# Patient Record
Sex: Female | Born: 1980
Health system: Southern US, Community
[De-identification: ages and names within clinical notes are randomized; demographics above are authoritative.]

## PROBLEM LIST (undated history)

## (undated) DIAGNOSIS — E0590 Thyrotoxicosis, unspecified without thyrotoxic crisis or storm: Secondary | ICD-10-CM

## (undated) HISTORY — PX: THYROIDECTOMY: SHX17

## (undated) HISTORY — PX: HERNIA REPAIR: SHX51

## (undated) HISTORY — PX: ABDOMINAL HYSTERECTOMY: SHX81

---

## 2002-02-23 ENCOUNTER — Emergency Department (HOSPITAL_COMMUNITY): Admission: EM | Admit: 2002-02-23 | Discharge: 2002-02-23 | Payer: Self-pay | Admitting: Emergency Medicine

## 2002-09-25 ENCOUNTER — Emergency Department (HOSPITAL_COMMUNITY): Admission: EM | Admit: 2002-09-25 | Discharge: 2002-09-25 | Payer: Self-pay | Admitting: Emergency Medicine

## 2004-01-16 ENCOUNTER — Emergency Department (HOSPITAL_COMMUNITY): Admission: EM | Admit: 2004-01-16 | Discharge: 2004-01-16 | Payer: Self-pay | Admitting: Emergency Medicine

## 2004-02-01 ENCOUNTER — Other Ambulatory Visit: Admission: RE | Admit: 2004-02-01 | Discharge: 2004-02-01 | Payer: Self-pay | Admitting: Obstetrics and Gynecology

## 2004-02-04 ENCOUNTER — Emergency Department (HOSPITAL_COMMUNITY): Admission: EM | Admit: 2004-02-04 | Discharge: 2004-02-04 | Payer: Self-pay | Admitting: Family Medicine

## 2004-05-25 ENCOUNTER — Inpatient Hospital Stay (HOSPITAL_COMMUNITY): Admission: AD | Admit: 2004-05-25 | Discharge: 2004-05-25 | Payer: Self-pay | Admitting: Obstetrics and Gynecology

## 2004-07-07 ENCOUNTER — Inpatient Hospital Stay (HOSPITAL_COMMUNITY): Admission: AD | Admit: 2004-07-07 | Discharge: 2004-07-07 | Payer: Self-pay | Admitting: Obstetrics and Gynecology

## 2004-07-09 ENCOUNTER — Inpatient Hospital Stay (HOSPITAL_COMMUNITY): Admission: AD | Admit: 2004-07-09 | Discharge: 2004-07-09 | Payer: Self-pay | Admitting: Obstetrics and Gynecology

## 2004-07-23 ENCOUNTER — Inpatient Hospital Stay (HOSPITAL_COMMUNITY): Admission: AD | Admit: 2004-07-23 | Discharge: 2004-07-25 | Payer: Self-pay | Admitting: Obstetrics and Gynecology

## 2004-08-29 ENCOUNTER — Inpatient Hospital Stay (HOSPITAL_COMMUNITY): Admission: AD | Admit: 2004-08-29 | Discharge: 2004-08-29 | Payer: Self-pay | Admitting: Obstetrics and Gynecology

## 2004-09-03 ENCOUNTER — Inpatient Hospital Stay (HOSPITAL_COMMUNITY): Admission: AD | Admit: 2004-09-03 | Discharge: 2004-09-06 | Payer: Self-pay | Admitting: Obstetrics and Gynecology

## 2004-09-03 ENCOUNTER — Encounter (INDEPENDENT_AMBULATORY_CARE_PROVIDER_SITE_OTHER): Payer: Self-pay | Admitting: Specialist

## 2004-09-10 ENCOUNTER — Inpatient Hospital Stay (HOSPITAL_COMMUNITY): Admission: AD | Admit: 2004-09-10 | Discharge: 2004-09-10 | Payer: Self-pay | Admitting: Obstetrics and Gynecology

## 2004-10-10 ENCOUNTER — Other Ambulatory Visit: Admission: RE | Admit: 2004-10-10 | Discharge: 2004-10-10 | Payer: Self-pay | Admitting: Obstetrics and Gynecology

## 2005-04-02 ENCOUNTER — Emergency Department (HOSPITAL_COMMUNITY): Admission: EM | Admit: 2005-04-02 | Discharge: 2005-04-02 | Payer: Self-pay | Admitting: Emergency Medicine

## 2005-11-14 ENCOUNTER — Other Ambulatory Visit: Admission: RE | Admit: 2005-11-14 | Discharge: 2005-11-14 | Payer: Self-pay | Admitting: Obstetrics and Gynecology

## 2005-12-15 ENCOUNTER — Encounter (INDEPENDENT_AMBULATORY_CARE_PROVIDER_SITE_OTHER): Payer: Self-pay | Admitting: *Deleted

## 2005-12-15 ENCOUNTER — Ambulatory Visit (HOSPITAL_COMMUNITY): Admission: RE | Admit: 2005-12-15 | Discharge: 2005-12-15 | Payer: Self-pay | Admitting: Obstetrics and Gynecology

## 2009-06-25 ENCOUNTER — Emergency Department (HOSPITAL_COMMUNITY): Admission: EM | Admit: 2009-06-25 | Discharge: 2009-06-26 | Payer: Self-pay | Admitting: Emergency Medicine

## 2010-01-02 ENCOUNTER — Inpatient Hospital Stay (HOSPITAL_COMMUNITY): Admission: AD | Admit: 2010-01-02 | Discharge: 2010-01-02 | Payer: Self-pay | Admitting: Obstetrics & Gynecology

## 2010-02-10 ENCOUNTER — Inpatient Hospital Stay (HOSPITAL_COMMUNITY): Admission: RE | Admit: 2010-02-10 | Discharge: 2010-02-13 | Payer: Self-pay | Admitting: Obstetrics and Gynecology

## 2010-02-13 ENCOUNTER — Ambulatory Visit: Payer: Self-pay | Admitting: Advanced Practice Midwife

## 2010-02-13 ENCOUNTER — Inpatient Hospital Stay (HOSPITAL_COMMUNITY): Admission: AD | Admit: 2010-02-13 | Discharge: 2010-02-13 | Payer: Self-pay | Admitting: Obstetrics and Gynecology

## 2011-03-05 LAB — FETAL FIBRONECTIN: Fetal Fibronectin: NEGATIVE

## 2011-03-05 LAB — URINE MICROSCOPIC-ADD ON

## 2011-03-05 LAB — URINALYSIS, ROUTINE W REFLEX MICROSCOPIC
Bilirubin Urine: NEGATIVE
Glucose, UA: NEGATIVE mg/dL
Hgb urine dipstick: NEGATIVE
Ketones, ur: NEGATIVE mg/dL
Nitrite: POSITIVE — AB
Protein, ur: NEGATIVE mg/dL
Specific Gravity, Urine: 1.025 (ref 1.005–1.030)
Urobilinogen, UA: 0.2 mg/dL (ref 0.0–1.0)
pH: 6 (ref 5.0–8.0)

## 2011-03-05 LAB — WET PREP, GENITAL
Clue Cells Wet Prep HPF POC: NONE SEEN
Trich, Wet Prep: NONE SEEN

## 2011-03-05 LAB — URINE CULTURE: Colony Count: 45000

## 2011-03-08 LAB — CBC
HCT: 28.5 % — ABNORMAL LOW (ref 36.0–46.0)
HCT: 30.8 % — ABNORMAL LOW (ref 36.0–46.0)
HCT: 31.9 % — ABNORMAL LOW (ref 36.0–46.0)
HCT: 38.3 % (ref 36.0–46.0)
Hemoglobin: 10 g/dL — ABNORMAL LOW (ref 12.0–15.0)
Hemoglobin: 10.4 g/dL — ABNORMAL LOW (ref 12.0–15.0)
Hemoglobin: 12.5 g/dL (ref 12.0–15.0)
Hemoglobin: 9.3 g/dL — ABNORMAL LOW (ref 12.0–15.0)
MCHC: 32.4 g/dL (ref 30.0–36.0)
MCHC: 32.4 g/dL (ref 30.0–36.0)
MCHC: 32.6 g/dL (ref 30.0–36.0)
MCHC: 32.7 g/dL (ref 30.0–36.0)
MCV: 85.8 fL (ref 78.0–100.0)
MCV: 86.1 fL (ref 78.0–100.0)
MCV: 86.6 fL (ref 78.0–100.0)
MCV: 86.6 fL (ref 78.0–100.0)
Platelets: 172 10*3/uL (ref 150–400)
Platelets: 194 10*3/uL (ref 150–400)
Platelets: 211 10*3/uL (ref 150–400)
Platelets: 224 10*3/uL (ref 150–400)
RBC: 3.29 MIL/uL — ABNORMAL LOW (ref 3.87–5.11)
RBC: 3.55 MIL/uL — ABNORMAL LOW (ref 3.87–5.11)
RBC: 3.71 MIL/uL — ABNORMAL LOW (ref 3.87–5.11)
RBC: 4.46 MIL/uL (ref 3.87–5.11)
RDW: 16 % — ABNORMAL HIGH (ref 11.5–15.5)
RDW: 16.1 % — ABNORMAL HIGH (ref 11.5–15.5)
RDW: 16.4 % — ABNORMAL HIGH (ref 11.5–15.5)
RDW: 16.5 % — ABNORMAL HIGH (ref 11.5–15.5)
WBC: 10.1 10*3/uL (ref 4.0–10.5)
WBC: 10.3 10*3/uL (ref 4.0–10.5)
WBC: 13.3 10*3/uL — ABNORMAL HIGH (ref 4.0–10.5)
WBC: 9.6 10*3/uL (ref 4.0–10.5)

## 2011-03-08 LAB — RPR: RPR Ser Ql: NONREACTIVE

## 2011-03-26 LAB — DIFFERENTIAL
Basophils Absolute: 0.1 10*3/uL (ref 0.0–0.1)
Basophils Relative: 1 % (ref 0–1)
Eosinophils Absolute: 0.2 10*3/uL (ref 0.0–0.7)
Eosinophils Relative: 2 % (ref 0–5)
Lymphocytes Relative: 34 % (ref 12–46)
Lymphs Abs: 3 10*3/uL (ref 0.7–4.0)
Monocytes Absolute: 0.7 10*3/uL (ref 0.1–1.0)
Monocytes Relative: 8 % (ref 3–12)
Neutro Abs: 5 10*3/uL (ref 1.7–7.7)
Neutrophils Relative %: 56 % (ref 43–77)

## 2011-03-26 LAB — BASIC METABOLIC PANEL
BUN: 12 mg/dL (ref 6–23)
CO2: 24 mEq/L (ref 19–32)
Calcium: 9.1 mg/dL (ref 8.4–10.5)
Chloride: 105 mEq/L (ref 96–112)
Creatinine, Ser: 0.62 mg/dL (ref 0.4–1.2)
GFR calc Af Amer: >60 mL/min (ref >60)
GFR calc non Af Amer: >60 mL/min (ref >60)
Glucose, Bld: 92 mg/dL (ref 70–99)
Potassium: 3.6 mEq/L (ref 3.5–5.1)
Sodium: 136 mEq/L (ref 135–145)

## 2011-03-26 LAB — CBC
HCT: 40.7 % (ref 36.0–46.0)
Hemoglobin: 13.2 g/dL (ref 12.0–15.0)
MCHC: 32.5 g/dL (ref 30.0–36.0)
MCV: 94.9 fL (ref 78.0–100.0)
Platelets: 219 10*3/uL (ref 150–400)
RBC: 4.29 MIL/uL (ref 3.87–5.11)
RDW: 12.1 % (ref 11.5–15.5)
WBC: 9 10*3/uL (ref 4.0–10.5)

## 2011-03-26 LAB — URINALYSIS, ROUTINE W REFLEX MICROSCOPIC
Bilirubin Urine: NEGATIVE
Glucose, UA: NEGATIVE mg/dL
Ketones, ur: NEGATIVE mg/dL
Leukocytes, UA: NEGATIVE
Nitrite: NEGATIVE
Protein, ur: NEGATIVE mg/dL
Specific Gravity, Urine: 1.028 (ref 1.005–1.030)
Urobilinogen, UA: 0.2 mg/dL (ref 0.0–1.0)
pH: 6.5 (ref 5.0–8.0)

## 2011-03-26 LAB — HCG, QUANTITATIVE, PREGNANCY: hCG, Beta Chain, Quant, S: 39224 m[IU]/mL — ABNORMAL HIGH (ref <5)

## 2011-03-26 LAB — ABO/RH: ABO/RH(D): O POS

## 2011-03-26 LAB — URINE MICROSCOPIC-ADD ON

## 2011-03-26 LAB — POCT PREGNANCY, URINE: Preg Test, Ur: POSITIVE

## 2011-05-05 NOTE — Op Note (Signed)
NAME:  Alison Scott, BOAK NO.:  [id number]   MEDICAL RECORD NO.:  [id number]                   PATIENT TYPE:  INP   LOCATION:  9126                                 FACILITY:  [facility]   PHYSICIAN:  Rudy Jew. Ashley Royalty, M.D.             DATE OF BIRTH:  [birth year]   DATE OF PROCEDURE:  09/03/2004  DATE OF DISCHARGE:                                 OPERATIVE REPORT   PREOPERATIVE DIAGNOSES:  1.  Intrauterine pregnancy at 17 weeks' gestation.  2.  Previous cesarean section.   POSTOPERATIVE DIAGNOSES:  1.  Intrauterine pregnancy at 85 weeks' gestation.  2.  Previous cesarean section.  3.  Pelvic adhesions, dense.   PROCEDURE:  1.  Repeat low transverse cesarean section.  2.  Lysis of adhesions.   SURGEON:  Rudy Jew. Ashley Royalty, M.D.   ANESTHESIA:  Spinal.   FINDINGS:  Six pound 3 ounce female, Apgars 8 at one minute, 9 at five  minutes, sent to the newborn nursery.   ESTIMATED BLOOD LOSS:  1400 mL.   COMPLICATIONS:  None.   PACKS AND DRAINS:  Foley.   COUNTS:  Sponge, needle and instrument count reported x2.   DESCRIPTION OF PROCEDURE:  The patient was taken to the operating room and  placed in the sitting position.  After a spinal anesthetic was administered,  she was then placed in the dorsal supine position and prepped and draped in  the usual manner for abdominal surgery.  Foley catheter was placed.  A  Pfannenstiel incision was made down through the patient's old skin incision.  The subcutaneous tissues were sharply dissected down to the fascia which was  nicked with a knife and incised transversely with Mayo scissors.  The  underlying rectus muscles were separated from the fascia using sharp and  blunt dissection.  During this process, dense adhesions were encountered,  and it was difficult to successful dissect in the abdominal cavity.  Once  the abdominal cavity was entered, additional dense adhesions were noted  between the uterus and anterior  abdominal wall.  There was some serosal  trauma to the anterior aspect of the uterine corpus which would later  require reapproximation.  With considerable effort and diligence, all of the  adhesions were lysed.  At this point, the uterus could be fully visualized.  A bladder flap was created by the side of the anterior uterine serosa, and  the bladder was sharply and bluntly dissected inferiorly.  It was held in  place with the bladder blade.  All in all from the initiation of the  procedure to time of uterine incision was approximately 35 minutes.   The uterus was then entered through a combination of sharp and blunt  dissection through a low transverse incision.  The amniotic fluid was noted  to be clear.  The infant was delivered from the vertex presentation in an  atraumatic fashion.  The amniotic fluid  was noted to be clear.  The infant  was delivered from the vertex presentation in an atraumatic manner.  The  infant was suctioned.  The cord was triply clamped, cut, and the infant  given immediately to the waiting pediatrics team.  The arterial cord pH was  obtained from an isolated segment.  Regular cord blood was obtained.  The  placenta and membranes were removed in their entirety and submitted to  pathology for histologic studies.  Later, the lab informed the room that the  pH had clotted and could not be interpreted.  As the infant was doing quite  well at that point, no further attempt was made to obtain cord blood for pH  determination.  The uterus was exteriorized . The uterus was then closed in  two running layers with #1 Vicryl.  The first was a running locking layer.  The second was a running, intermittent locking, imbricating layer.   At this point, the superficial serosa trauma superiorly noted on the uterus  relative to the main uterine incision was repaired without difficulty.  A 0  Vicryl running suture was employed for this purpose.  Hemostasis was noted.   Thereafter at this point, the uterus, tubes and ovaries were inspected and  found to be otherwise normal.  They were returned to the abdominal cavity.  Copious irrigation was accomplished.  Hemostasis was noted.  The peritoneum  was then closed with 3-0 Vicryl in a running fashion.  The fascia was closed  with 0 Vicryl in a running fashion.  A small button hole in the fascia  superior to the principal fascial incision was closed with 0 Vicryl in an  interrupted fashion.  The fascia was closed in its entirety with 0 Vicryl in  a running fashion.  Copious irrigation was accomplished.  Hemostasis was  noted.  The skin was closed with staples.  At the conclusion of the  procedure, the urine was clear and copious.  The patient tolerated the  procedure extremely well and was returned to the recovery room in good  condition.      JAM/MEDQ  D:  09/04/2004  T:  09/05/2004  Job:  [id number]

## 2011-05-05 NOTE — H&P (Signed)
NAME:  Alison Scott, Alison Scott               ACCOUNT NO.:  [id number]   MEDICAL RECORD NO.:  [id number]          PATIENT TYPE:   LOCATION:                                 FACILITY:   PHYSICIAN:  Rudy Jew. Ashley Royalty, M.D.     DATE OF BIRTH:   DATE OF ADMISSION:  12/15/2005  DATE OF DISCHARGE:                                HISTORY & PHYSICAL   This is a 30 year old gravida 5, para 2, AB 3, who recently had a Pap smear  in my office revealing CIN-I.  Colposcopy was performed December 08, 2005,  and revealed no lesions.  However, the colposcopy was felt to be inadequate.  Hence, the patient is scheduled for loop electrical excision procedure.   MEDICATIONS:  None.   PAST MEDICAL HISTORY:  Medical:  Negative.   Surgical:  C-section.   ALLERGIES:  No known drug allergies.   FAMILY HISTORY:  Noncontributory.   SOCIAL HISTORY:  The patient is a smoker.  She denies any significant use of  alcohol.   REVIEW OF SYSTEMS:  Noncontributory.   PHYSICAL EXAMINATION:  GENERAL:  A well-developed, well-nourished, pleasant  black female in no acute distress.  VITAL SIGNS:  Afebrile, vital signs stable.  CHEST:  Lungs are clear.  CARDIAC:  Regular rate and rhythm.  ABDOMEN:  Soft and nontender.  MUSCULOSKELETAL:  No CVA tenderness.  PELVIC:  Please see December 08, 2005.  External genitalia within normal  limits.  Vagina and cervix were without gross lesions.  Bimanual examination  performed recently in the office revealed the uterus to be approximately 8 x  4 x 4 cm and no adnexal masses palpable.   IMPRESSION:  1.  Cervical intraepithelial neoplasia I on recent Pap.  2.  Unsatisfactory colposcopy.   PLAN:  Loop electrical excision procedure.  The risks, benefits,  complications and alternatives were fully discussed with the patient.  She  states she understands and accepts.  Questions were invited and answered.  The patient requested the procedure to be done at the outpatient surgical  center  at [facility] as opposed to in the office setting.      Brossard A. Ashley Royalty, M.D.  Electronically Signed     JAM/MEDQ  D:  12/15/2005  T:  12/15/2005  Job:  [id number]

## 2011-05-05 NOTE — H&P (Signed)
NAME:  Alison Scott, Alison Scott NO.:  [id number]   MEDICAL RECORD NO.:  [id number]                   PATIENT TYPE:  MAT   LOCATION:  MATC                                 [facility]:  [facility]   PHYSICIAN:  Rudy Jew. Ashley Royalty, M.D.             DATE OF BIRTH:  1981/05/12   DATE OF ADMISSION:  07/23/2004  DATE OF DISCHARGE:                                HISTORY & PHYSICAL   HISTORY OF PRESENT ILLNESS:  [age]-year-old gravida 5, para 1-0-3-1,  [facility] September 10, 2004, approximately 33 weeks' gestation.  Prenatal care  complicated by previous cesarean section and preterm labor.  The patient has  recently been maintained on terbutaline 5 mg p.o. q.6 h.  Earlier today, she  began breaking through to the point that she came to Maternity Admissions.  She was at [facility] on or about July 07, 2004, at which time  cultures were obtained and the terbutaline adjusted for continued outpatient  therapy.   PAST MEDICAL HISTORY:  For the remainder of the past medical history, please  see chart.   PHYSICAL EXAMINATION:  GENERAL:  Well-developed, well-nourished, pleasant  black female in no acute distress, afebrile.  VITAL SIGNS:  Vital signs stable.  SKIN:  Skin warm and dry without lesions.  LYMPH:  There is no supraclavicular, cervical or inguinal adenopathy.  HEENT:  Normocephalic.  NECK:  Neck supple without thyromegaly.  CHEST:  Lungs are clear.  HEART:  Regular rate and rhythm without murmurs, gallops or rubs.  ABDOMEN:  Abdomen is gravid with a fundal height of approximately 33.  Fetal  heart tones are auscultated.  Contractions are approximately every 10  minutes.  MUSCULOSKELETAL:  Examination reveals full range of motion without edema,  cyanosis or CVA tenderness.  PELVIC:  Cervical examination reveals the cervix to be closed, soft,  anterior with presenting part high.   IMAGING STUDIES:  Ultrasound revealed a cervical length of 2.8 cm.  This is  in  contrast to the last one several weeks ago, which was consistent with a  length of 3.0 cm.  Biophysical profile was 8/8.   IMPRESSION:  1. Intrauterine pregnancy at 29 weeks' gestation.  2. Previous cesarean section.  3. Preterm labor with modest progressive cervical shortening on outpatient     terbutaline.   PLAN:  1. Admit.  2. IV magnesium therapy.  3. Will give betamethasone 12.5 mg now with repeat in 24 hours.  Risks,     benefits, complications and alternatives fully discussed with the     patient.  She states she understands and accepts.  Questions invited and     answered.                                              Tomey A. Ashley Royalty, M.D.  JAM/MEDQ  D:  07/23/2004  T:  07/23/2004  Job:  [id number]

## 2011-05-05 NOTE — Discharge Summary (Signed)
NAMESHARINA, Alison Scott              ACCOUNT NO.:  [id number]   MEDICAL RECORD NO.:  [id number]          PATIENT TYPE:  INP   LOCATION:  9126                          [facility]ILITY[facility]  [facility]   PHYSICIAN:  Rudy Jew. Ashley Royalty, M.D.DATE OF BIRTH:  Apr 20, 1981   DATE OF ADMISSION:  09/03/2004  DATE OF DISCHARGE:  09/06/2004                                 DISCHARGE SUMMARY   DISCHARGE DIAGNOSES:  1.  Intrauterine pregnancy at term, delivered.  2.  Previous cesarean section.  3.  Labor.   OPERATIONS AND SPECIAL PROCEDURES:  Repeat low transverse cesarean section  with lysis of adhesions.   CONSULTATIONS:  None.   DISCHARGE MEDICATIONS:  Percocet, Chromagen.   HISTORY AND PHYSICAL:  This is a 30 year old gravida 5 para 1-0-3-1 at [redacted]  weeks gestation.  Prenatal care was complicated by previous cesarean section  and preterm labor.  The patient was scheduled for a repeat cesarean section  September 05, 2004.  She presented in labor.  For the remainder of the  history and physical, please see chart.   HOSPITAL COURSE:  The patient was admitted to [facility]  [place].  Admission laboratory studies were drawn.  On September 03, 2004  she was taken to the operating room and underwent repeat low transverse  cesarean section.  There were significant pelvic adhesions present and thus  the patient also underwent lysis of adhesions.  The infant was a 6-pound 3-  ounce female, Apgars 8 at one minute and 9 at five minutes, sent to the  newborn nursery.  The patient's postpartum course was complicated only by a  modest anemia which was asymptomatic.  On postoperative day #3 she was  discharged home afebrile and in satisfactory condition.   ACCESSORY CLINICAL FINDINGS:  Hemoglobin and hematocrit on admission were  12.1 and 36.0 respectively.  Repeat values obtained September 05, 2004 were  8.2 and 24.8 respectively.  Type and Rh revealed O positive blood.   DISPOSITION:  The patient is to return to  [facility]etrics  in 4-6 weeks for postpartum evaluation.      JAM/MEDQ  D:  09/29/2004  T:  09/29/2004  Job:  [id number]

## 2011-05-05 NOTE — Discharge Summary (Signed)
NAME:  Alison Scott, Alison Scott              ACCOUNT NO.:  [id number]   MEDICAL RECORD NO.:  [id number]          PATIENT TYPE:  INP   LOCATION:  9159                          [facility]   PHYSICIAN:  Rudy Jew. Ashley Royalty, M.D.DATE OF BIRTH:  10/05/1981   DATE OF ADMISSION:  07/23/2004  DATE OF DISCHARGE:  07/25/2004                                 DISCHARGE SUMMARY   DISCHARGE DIAGNOSES:  1.  Intrauterine pregnancy at [redacted] weeks gestation.  2.  Previous cesarean section.  3.  Preterm labor.   OPERATIONS AND SPECIAL PROCEDURES:  None.   CONSULTATIONS:  None.   DISCHARGE MEDICATIONS:  Terbutaline.   HISTORY AND PHYSICAL:  This is a 30 year old gravida 5 para 1-0-3-1 with [facility]  September 10, 2004 at approximately [redacted] weeks gestation.  Prenatal care was  complicated by a previous cesarean section and preterm labor.  The patient  has been maintained during this gestation on terbutaline 5 mg p.o. q.6h.  Earlier today, she began breaking through to the point that she went to  maternity admissions.  She was noted to have contractions approximately  every 10 minutes at maternity admissions and was thus admitted for further  evaluation and therapy.  For the remainder of the history and physical,  please see chart.   HOSPITAL COURSE:  The patient was admitted to [facility]  [place].  Admission laboratory studies were drawn.  She was placed on IV  magnesium sulfate.  In addition, she was given betamethasone to accelerate  the fetal lung maturity.  On July 24, 2004 she was changed to terbutaline.  On July 25, 2004 she was felt to have responded satisfactorily to the  tocolytic agents and was discharged home on p.o. terbutaline afebrile and in  satisfactory condition.   DISPOSITION:  The patient is to return to [facility] and Obstetrics  in the next several days for further evaluation and therapy.      JAM/MEDQ  D:  09/29/2004  T:  09/29/2004  Job:  [id number]

## 2011-05-05 NOTE — Op Note (Signed)
NAME:  Alison Scott, Alison Scott               ACCOUNT NO.:  [id number]   MEDICAL RECORD NO.:  [id number]          PATIENT TYPE:  AMB   LOCATION:                                [facility]   PHYSICIAN:  Beharry A. Ashley Royalty, M.D.DATE OF BIRTH:  Jan 04, 1981   DATE OF PROCEDURE:  12/15/2005  DATE OF DISCHARGE:                                 OPERATIVE REPORT   PREOPERATIVE DIAGNOSES:  1.  Cervical intraepithelial neoplasia I on recent Papanicolaou smear.  2.  Unsatisfactory colposcopy.  3.  Desire for diagnostic/therapeutic procedure in the operating room as      opposed to the office.   POSTOPERATIVE DIAGNOSES:  1.  Cervical intraepithelial neoplasia I on recent Papanicolaou smear.  2.  Unsatisfactory colposcopy.  3.  Desire for diagnostic/therapeutic procedure in the operating room as      opposed to the office.  4.  Pathology pending.   PROCEDURE:  Loop electrical excision procedure.   SURGEON:  Rudy Jew. Ashley Royalty, M.D.   ANESTHESIA:  General.   ESTIMATED BLOOD LOSS:  Less than 25 mL.   COMPLICATIONS:  None.   PACKS AND DRAINS:  None.   PROCEDURE:  The patient was taken to the operating room and placed in dorsal  supine position.  After anesthetic was administered, she was placed in the  lithotomy position and prepped and draped in usual manner for vaginal  surgery.  The cervix was visualized with the colposcope.  Similar findings  were seen as an office on December 08, 2005.  Using the small (green) LEEP  electrode,  a LEEP specimen was obtained using the cutting waveform at 30  watts power.  The specimen was incised at 12 o'clock and submitted to  pathology for histologic studies in formalin.  Next the ball was used using  the coagulation waveform at 50 watts power to obtain hemostasis.  Hemostasis  was noted.  The bed was treated with Monsel's solution as well.  The  procedure was terminated.   The patient was then taken to the recovery room in excellent condition.     Hemberger  A. Ashley Royalty, M.D.  Electronically Signed    JAM/MEDQ  D:  12/22/2005  T:  12/22/2005  Job:  [id number]

## 2011-07-12 ENCOUNTER — Encounter: Payer: Self-pay | Admitting: *Deleted

## 2011-07-12 ENCOUNTER — Emergency Department (INDEPENDENT_AMBULATORY_CARE_PROVIDER_SITE_OTHER): Payer: BC Managed Care – PPO

## 2011-07-12 ENCOUNTER — Emergency Department (HOSPITAL_BASED_OUTPATIENT_CLINIC_OR_DEPARTMENT_OTHER)
Admission: EM | Admit: 2011-07-12 | Discharge: 2011-07-12 | Disposition: A | Discharge status: Home / Self Care | Payer: BC Managed Care – PPO | Attending: Emergency Medicine | Admitting: Emergency Medicine

## 2011-07-12 DIAGNOSIS — G43909 Migraine, unspecified, not intractable, without status migrainosus: Secondary | ICD-10-CM

## 2011-07-12 DIAGNOSIS — R197 Diarrhea, unspecified: Secondary | ICD-10-CM

## 2011-07-12 DIAGNOSIS — R110 Nausea: Secondary | ICD-10-CM

## 2011-07-12 DIAGNOSIS — R51 Headache: Secondary | ICD-10-CM

## 2011-07-12 LAB — CBC
HCT: 37.5 % (ref 36.0–46.0)
Hemoglobin: 12.6 g/dL (ref 12.0–15.0)
MCH: 29 pg (ref 26.0–34.0)
MCHC: 33.6 g/dL (ref 30.0–36.0)
MCV: 86.2 fL (ref 78.0–100.0)
Platelets: 267 10*3/uL (ref 150–400)
RBC: 4.35 MIL/uL (ref 3.87–5.11)
RDW: 12.4 % (ref 11.5–15.5)
WBC: 6.1 10*3/uL (ref 4.0–10.5)

## 2011-07-12 LAB — BASIC METABOLIC PANEL
BUN: 14 mg/dL (ref 6–23)
CO2: 26 mEq/L (ref 19–32)
Calcium: 10 mg/dL (ref 8.4–10.5)
Chloride: 104 mEq/L (ref 96–112)
Creatinine, Ser: <0.47 mg/dL — ABNORMAL LOW (ref 0.50–1.10)
Glucose, Bld: 96 mg/dL (ref 70–99)
Potassium: 3.5 mEq/L (ref 3.5–5.1)
Sodium: 141 mEq/L (ref 135–145)

## 2011-07-12 LAB — PREGNANCY, URINE: Preg Test, Ur: NEGATIVE

## 2011-07-12 MED ORDER — KETOROLAC TROMETHAMINE 30 MG/ML IJ SOLN
30.0000 mg | Freq: Once | INTRAMUSCULAR | Status: AC
  Administered 2011-07-12: 30 mg via INTRAVENOUS
  Filled 2011-07-12: qty 1

## 2011-07-12 MED ORDER — BUTALBITAL-ASA-CAFFEINE 50-325-40 MG PO CAPS: 1.0000 | ORAL_CAPSULE | Freq: Two times a day (BID) | ORAL | Status: AC | PRN

## 2011-07-12 MED ORDER — ONDANSETRON 8 MG PO TBDP: 8.0000 mg | ORAL_TABLET | Freq: Three times a day (TID) | ORAL | Status: AC | PRN

## 2011-07-12 MED ORDER — METOCLOPRAMIDE HCL 5 MG/ML IJ SOLN
10.0000 mg | Freq: Once | INTRAMUSCULAR | Status: AC
  Administered 2011-07-12: 10 mg via INTRAVENOUS
  Filled 2011-07-12: qty 2

## 2011-07-12 MED ORDER — DIPHENHYDRAMINE HCL 50 MG/ML IJ SOLN
12.5000 mg | Freq: Once | INTRAMUSCULAR | Status: AC
  Administered 2011-07-12: 12.5 mg via INTRAVENOUS
  Filled 2011-07-12: qty 1

## 2011-07-12 NOTE — ED Notes (Signed)
Pt c/o headache intermittently x 1-2 weeks. Also c/o nausea. Some greenish colored diarrhea x2-3days. Denies abdominal pains or vomiting. Pt mostly concerned for severe headache today.

## 2011-07-12 NOTE — ED Provider Notes (Addendum)
History     Chief Complaint  Patient presents with  . Headache  . Nausea  . Diarrhea   HPI Comments: Light and noise seems to exacerbate the headache.  It tends to resolves after a period of time. Patient states she had a little bit of a GI bug several days ago but then that seemed to resolve. She noticed last couple of days having this headache but at times tends to be more severe but then will completely resolve. She was driving on her way to work today the headache became more severe again and she felt very lightheaded so she decided to drive to the emergency room. She does not have a history of headaches like this in the past. There is no sudden onset and she's not had any neck pain or neck stiffness.  Patient is a 30 y.o. female presenting with headaches.  Headache  This is a new problem. The current episode started more than 2 days ago. Progression since onset: it has been waxing and waning. The headache is associated with nothing. The pain is located in the frontal region. Quality: aqueezing. The pain is moderate. The pain does not radiate. Associated symptoms include malaise/fatigue, near-syncope and nausea. Pertinent negatives include no fever, no chest pressure, no syncope, no shortness of breath and no vomiting. She has tried NSAIDs for the symptoms.    History reviewed. No pertinent past medical history.  Past Surgical History  Procedure Date  . Hernia repair   . Cesarean section     History reviewed. No pertinent family history.  History  Substance Use Topics  . Smoking status: Former Games developer  . Smokeless tobacco: Not on file  . Alcohol Use: Yes     occasionally    OB History    Grav Para Term Preterm Abortions TAB SAB Ect Mult Living                  Review of Systems  Constitutional: Positive for malaise/fatigue. Negative for fever.  Respiratory: Negative for shortness of breath.   Cardiovascular: Positive for near-syncope. Negative for syncope.    Gastrointestinal: Positive for nausea. Negative for vomiting and diarrhea (a few days ago).  Neurological: Positive for headaches.  All other systems reviewed and are negative.    Physical Exam  BP 119/66  Pulse 116  Temp(Src) 99 F (37.2 C) (Oral)  Resp 18  Ht 5\' 2"  (1.575 m)  Wt 155 lb (70.308 kg)  BMI 28.35 kg/m2  SpO2 100%  Physical Exam  Constitutional: She is oriented to person, place, and time. She appears well-developed and well-nourished. No distress.  HENT:  Head: Normocephalic and atraumatic.  Right Ear: External ear normal.  Left Ear: External ear normal.  Eyes: Conjunctivae are normal. Right eye exhibits no discharge. Left eye exhibits no discharge. No scleral icterus.  Neck: Neck supple. No rigidity. No tracheal deviation and normal range of motion present. No Kernig's sign noted.  Cardiovascular: Normal rate, regular rhythm and intact distal pulses.   Pulmonary/Chest: Effort normal and breath sounds normal. No stridor. No respiratory distress. She has no wheezes. She has no rales.  Abdominal: Soft. Bowel sounds are normal. She exhibits no distension. There is no tenderness. There is no rebound and no guarding.  Musculoskeletal: She exhibits no edema and no tenderness.  Neurological: She is alert and oriented to person, place, and time. She has normal strength. No cranial nerve deficit ( no gross defecits noted) or sensory deficit. She exhibits normal muscle  tone. She displays no seizure activity. Coordination normal. GCS eye subscore is 4. GCS verbal subscore is 5. GCS motor subscore is 6.       5 out of 5 strength bilateral upper extremity and lower extremity, normal sensation to light touch throughout all extremities,  Skin: Skin is warm and dry. No rash noted.  Psychiatric: She has a normal mood and affect.    ED Course  Procedures  Labs Reviewed  CBC  PREGNANCY, URINE  BASIC METABOLIC PANEL   all within normal limits  Ct Head Wo Contrast  07/12/2011   *RADIOLOGY REPORT*  Clinical Data: Headache, nausea and diarrhea.  CT HEAD WITHOUT CONTRAST  Technique:  Contiguous axial images were obtained from the base of the skull through the vertex without contrast.  Comparison: None.  Findings: The brain appears normal without acute infarct, hemorrhage, mass, mass effect, midline shift or abnormal extra axial fluid collection. No hydrocephalus. Imaged paranasal sinuses and mastoid air cells appear clear.  IMPRESSION: Negative exam.  Original Report Authenticated By: [id number]  8:22 AM  resting comfortably  8:49 AM Patient feels better at this time but he did go home.  findings and plans were discussed with her MDM Symptoms sound similar to a migraine type of headache with 6 waxing and waning nature. She mentioned to gets better after she lies down and rests. At this time doubt meningitis, subarachnoid hemorrhage. Will check labs and perform a noncontrast head CT at this time to rule out any sort of mass lesion.  Workup has been negative. Patient treated empirically for migraine type headache. Suspect this is the etiology of her symptoms discharged home to follow up with primary care doctor will prescribe symptomatic medications      Celene Kras, MD 07/12/11 [id number]  Celene Kras, MD 07/12/11 [phone]

## 2011-07-12 NOTE — ED Notes (Signed)
IV started, blood collected and pt medicated.  She reports a headache 5/10 prior to administration of medication..  Lights dimmed for comfort.

## 2011-10-19 ENCOUNTER — Encounter: Payer: Self-pay | Admitting: *Deleted

## 2011-10-19 ENCOUNTER — Emergency Department (INDEPENDENT_AMBULATORY_CARE_PROVIDER_SITE_OTHER): Payer: Self-pay

## 2011-10-19 ENCOUNTER — Emergency Department (HOSPITAL_BASED_OUTPATIENT_CLINIC_OR_DEPARTMENT_OTHER)
Admission: EM | Admit: 2011-10-19 | Discharge: 2011-10-19 | Disposition: A | Discharge status: Home / Self Care | Payer: Self-pay | Attending: Emergency Medicine | Admitting: Emergency Medicine

## 2011-10-19 DIAGNOSIS — N83209 Unspecified ovarian cyst, unspecified side: Secondary | ICD-10-CM | POA: Insufficient documentation

## 2011-10-19 DIAGNOSIS — R110 Nausea: Secondary | ICD-10-CM

## 2011-10-19 DIAGNOSIS — R1031 Right lower quadrant pain: Secondary | ICD-10-CM

## 2011-10-19 DIAGNOSIS — B9689 Other specified bacterial agents as the cause of diseases classified elsewhere: Secondary | ICD-10-CM | POA: Insufficient documentation

## 2011-10-19 DIAGNOSIS — N838 Other noninflammatory disorders of ovary, fallopian tube and broad ligament: Secondary | ICD-10-CM

## 2011-10-19 DIAGNOSIS — N760 Acute vaginitis: Secondary | ICD-10-CM | POA: Insufficient documentation

## 2011-10-19 DIAGNOSIS — R7989 Other specified abnormal findings of blood chemistry: Secondary | ICD-10-CM | POA: Insufficient documentation

## 2011-10-19 DIAGNOSIS — A499 Bacterial infection, unspecified: Secondary | ICD-10-CM | POA: Insufficient documentation

## 2011-10-19 DIAGNOSIS — K5289 Other specified noninfective gastroenteritis and colitis: Secondary | ICD-10-CM | POA: Insufficient documentation

## 2011-10-19 DIAGNOSIS — K529 Noninfective gastroenteritis and colitis, unspecified: Secondary | ICD-10-CM

## 2011-10-19 LAB — COMPREHENSIVE METABOLIC PANEL
ALT: 58 U/L — ABNORMAL HIGH (ref 0–35)
AST: 45 U/L — ABNORMAL HIGH (ref 0–37)
Albumin: 3.5 g/dL (ref 3.5–5.2)
Alkaline Phosphatase: 159 U/L — ABNORMAL HIGH (ref 39–117)
BUN: 10 mg/dL (ref 6–23)
CO2: 24 mEq/L (ref 19–32)
Calcium: 9.5 mg/dL (ref 8.4–10.5)
Chloride: 104 mEq/L (ref 96–112)
Creatinine, Ser: 0.4 mg/dL — ABNORMAL LOW (ref 0.50–1.10)
GFR calc Af Amer: >90 mL/min (ref >90)
GFR calc non Af Amer: >90 mL/min (ref >90)
Glucose, Bld: 106 mg/dL — ABNORMAL HIGH (ref 70–99)
Potassium: 3.7 mEq/L (ref 3.5–5.1)
Sodium: 139 mEq/L (ref 135–145)
Total Bilirubin: 0.2 mg/dL — ABNORMAL LOW (ref 0.3–1.2)
Total Protein: 6.9 g/dL (ref 6.0–8.3)

## 2011-10-19 LAB — WET PREP, GENITAL
Trich, Wet Prep: NONE SEEN
Yeast Wet Prep HPF POC: NONE SEEN

## 2011-10-19 LAB — CBC
HCT: 36.5 % (ref 36.0–46.0)
Hemoglobin: 12.2 g/dL (ref 12.0–15.0)
MCH: 27 pg (ref 26.0–34.0)
MCHC: 33.4 g/dL (ref 30.0–36.0)
MCV: 80.8 fL (ref 78.0–100.0)
Platelets: 247 10*3/uL (ref 150–400)
RBC: 4.52 MIL/uL (ref 3.87–5.11)
RDW: 12.7 % (ref 11.5–15.5)
WBC: 5.7 10*3/uL (ref 4.0–10.5)

## 2011-10-19 LAB — DIFFERENTIAL
Basophils Absolute: 0 10*3/uL (ref 0.0–0.1)
Basophils Relative: 0 % (ref 0–1)
Eosinophils Absolute: 0.1 10*3/uL (ref 0.0–0.7)
Eosinophils Relative: 2 % (ref 0–5)
Lymphocytes Relative: 45 % (ref 12–46)
Lymphs Abs: 2.6 10*3/uL (ref 0.7–4.0)
Monocytes Absolute: 0.6 10*3/uL (ref 0.1–1.0)
Monocytes Relative: 11 % (ref 3–12)
Neutro Abs: 2.4 10*3/uL (ref 1.7–7.7)
Neutrophils Relative %: 42 % — ABNORMAL LOW (ref 43–77)

## 2011-10-19 LAB — URINALYSIS, ROUTINE W REFLEX MICROSCOPIC
Bilirubin Urine: NEGATIVE
Glucose, UA: NEGATIVE mg/dL
Hgb urine dipstick: NEGATIVE
Ketones, ur: NEGATIVE mg/dL
Leukocytes, UA: NEGATIVE
Nitrite: NEGATIVE
Protein, ur: NEGATIVE mg/dL
Specific Gravity, Urine: 1.01 (ref 1.005–1.030)
Urobilinogen, UA: 1 mg/dL (ref 0.0–1.0)
pH: 7.5 (ref 5.0–8.0)

## 2011-10-19 LAB — PREGNANCY, URINE: Preg Test, Ur: NEGATIVE

## 2011-10-19 MED ORDER — OXYCODONE-ACETAMINOPHEN 5-325 MG PO TABS: 2.0000 | ORAL_TABLET | ORAL | Status: AC | PRN

## 2011-10-19 MED ORDER — MORPHINE SULFATE 4 MG/ML IJ SOLN
4.0000 mg | Freq: Once | INTRAMUSCULAR | Status: AC
  Administered 2011-10-19: 4 mg via INTRAVENOUS
  Filled 2011-10-19: qty 1

## 2011-10-19 MED ORDER — ONDANSETRON HCL 4 MG/2ML IJ SOLN
4.0000 mg | Freq: Once | INTRAMUSCULAR | Status: AC
  Administered 2011-10-19: 4 mg via INTRAVENOUS
  Filled 2011-10-19: qty 2

## 2011-10-19 MED ORDER — METRONIDAZOLE 500 MG PO TABS: 500.0000 mg | ORAL_TABLET | Freq: Two times a day (BID) | ORAL | Status: AC

## 2011-10-19 MED ORDER — IOHEXOL 300 MG/ML  SOLN
100.0000 mL | Freq: Once | INTRAMUSCULAR | Status: AC | PRN
  Administered 2011-10-19: 100 mL via INTRAVENOUS

## 2011-10-19 MED ORDER — CIPROFLOXACIN HCL 500 MG PO TABS: 500.0000 mg | ORAL_TABLET | Freq: Two times a day (BID) | ORAL | Status: AC

## 2011-10-19 NOTE — ED Provider Notes (Signed)
History     CSN: [id number] Arrival date & time: 10/19/2011  2:59 PM   First MD Initiated Contact with Patient 10/19/11 1630      Chief Complaint  Patient presents with  . Abdominal Pain    rlq    (Consider location/radiation/quality/duration/timing/severity/associated sxs/prior treatment) Patient is a 30 y.o. female presenting with abdominal pain. The history is provided by the patient. No language interpreter was used.  Abdominal Pain The primary symptoms of the illness include abdominal pain and nausea. The primary symptoms of the illness do not include vomiting, diarrhea or vaginal discharge. The current episode started 13 to 24 hours ago. The onset of the illness was gradual. The problem has been gradually worsening.  The patient states that she believes she is currently not pregnant. The patient has not had a change in bowel habit. Symptoms associated with the illness do not include diaphoresis, constipation, frequency or back pain.    History reviewed. No pertinent past medical history.  No past surgical history on file.  No family history on file.  History  Substance Use Topics  . Smoking status: Not on file  . Smokeless tobacco: Not on file  . Alcohol Use: Not on file    OB History    Grav Para Term Preterm Abortions TAB SAB Ect Mult Living                  Review of Systems  Constitutional: Negative for diaphoresis.  Gastrointestinal: Positive for nausea and abdominal pain. Negative for vomiting, diarrhea and constipation.  Genitourinary: Negative for frequency and vaginal discharge.  Musculoskeletal: Negative for back pain.  All other systems reviewed and are negative.    Allergies  Review of patient's allergies indicates no known allergies.  Home Medications   Current Outpatient Rx  Name Route Sig Dispense Refill  . THERA M PLUS PO TABS Oral Take 1 tablet by mouth daily.        BP 105/62  Pulse 104  Temp(Src) 97.8 F (36.6 C) (Oral)  Resp 22   Ht 5\' 2"  (1.575 m)  Wt 145 lb (65.772 kg)  BMI 26.52 kg/m2  SpO2 100%  LMP 09/18/2011  Physical Exam  Nursing note and vitals reviewed. Constitutional: She appears well-developed and well-nourished.  HENT:  Head: Normocephalic and atraumatic.  Eyes: Pupils are equal, round, and reactive to light.  Neck: Normal range of motion.  Cardiovascular: Normal rate and regular rhythm.   Pulmonary/Chest: Effort normal and breath sounds normal.  Abdominal: Soft. There is tenderness in the right lower quadrant.  Genitourinary: Cervix exhibits no motion tenderness. Vaginal discharge found.  Neurological: She is alert.  Skin: Skin is warm and dry.  Psychiatric: She has a normal mood and affect.    ED Course  Procedures (including critical care time)  Labs Reviewed  URINALYSIS, ROUTINE W REFLEX MICROSCOPIC - Abnormal; Notable for the following:    Appearance CLOUDY (*)    All other components within normal limits  DIFFERENTIAL - Abnormal; Notable for the following:    Neutrophils Relative 42 (*)    All other components within normal limits  COMPREHENSIVE METABOLIC PANEL - Abnormal; Notable for the following:    Glucose, Bld 106 (*)    Creatinine, Ser 0.40 (*)    AST 45 (*)    ALT 58 (*)    Alkaline Phosphatase 159 (*)    Total Bilirubin 0.2 (*)    All other components within normal limits  WET PREP, GENITAL - Abnormal;  Notable for the following:    Clue Cells, Wet Prep FEW (*)    WBC, Wet Prep HPF POC FEW (*)    All other components within normal limits  PREGNANCY, URINE  CBC  GC/CHLAMYDIA PROBE AMP, GENITAL   Ct Abdomen Pelvis W Contrast  10/19/2011  *RADIOLOGY REPORT*  Clinical Data: Right lower quadrant abdominal pain and nausea. History of umbilical hernia repair approximately 9 months ago.  CT ABDOMEN AND PELVIS WITH CONTRAST 10/19/2011:  Technique:  Multidetector CT imaging of the abdomen and pelvis was performed following the standard protocol during bolus administration of  intravenous contrast.  Contrast: OMNIPAQUE IOHEXOL 300 MG/ML IV. Oral contrast was also administered.  Comparison: None.  Findings: Normal appendix in the right upper pelvis.  Stomach normal in appearance, filled with food.  Wall thickening involving the duodenum and proximal jejunum.  Remainder of the small bowel unremarkable.  No obstruction.  Moderate stool throughout the colon which is normal in appearance.  No ascites.  Normal appearing liver, spleen, pancreas, right adrenal gland, and kidneys.  Calcification (likely dystrophic) involving the otherwise normal appearing left adrenal gland.  No visible aorto-iliofemoral atherosclerosis.  No significant lymphadenopathy.  Uterus normal in appearance for age.  Enlarged right ovary containing a cyst with an enhancing wall, consistent with a functional cyst.  Right ovarian varicocele.  Normal-appearing left ovary by CT.  No free pelvic fluid.  Phleboliths in the pelvis. Urinary bladder unremarkable.  Bone window images unremarkable.  Visualized lung bases clear. Heart size normal.  IMPRESSION:  1.  Wall thickening involving the duodenum and proximal jejunum over a several centimeter segment, consistent with enteritis. 2.  Normal-appearing appendix. 3.  Mildly enlarged right ovary with right ovarian varicocele.  Original Report Authenticated By: Arnell Sieving, M.D.     1. Ovarian cyst   2. Enteritis   3. Bacterial vaginosis   4. Elevated LFTs       MDM  Pt is feeling a lot better at this time and is tolerating QI:ONGE treat on an outpt basis     Medical screening examination/treatment/procedure(s) were performed by non-physician practitioner and as supervising physician I was immediately available for consultation/collaboration. Osvaldo Human, M.D.    Teressa Lower, NP 10/19/11 1853  Carleene Cooper III, MD 10/20/11 [phone]

## 2011-10-19 NOTE — ED Notes (Signed)
EMS reports patient has had RLQ abdominal pain all morning.  Pain has gradually gotten worse.  IV # 20 right forearm, zofran 4mg  iv given.  Nausea no vomiting.

## 2011-10-20 LAB — GC/CHLAMYDIA PROBE AMP, GENITAL
Chlamydia, DNA Probe: NEGATIVE
GC Probe Amp, Genital: NEGATIVE

## 2011-10-21 ENCOUNTER — Encounter (HOSPITAL_BASED_OUTPATIENT_CLINIC_OR_DEPARTMENT_OTHER): Payer: Self-pay | Admitting: Emergency Medicine

## 2011-10-21 ENCOUNTER — Emergency Department (HOSPITAL_BASED_OUTPATIENT_CLINIC_OR_DEPARTMENT_OTHER)
Admission: EM | Admit: 2011-10-21 | Discharge: 2011-10-21 | Disposition: A | Discharge status: Home / Self Care | Payer: BC Managed Care – PPO | Attending: Emergency Medicine | Admitting: Emergency Medicine

## 2011-10-21 DIAGNOSIS — R1110 Vomiting, unspecified: Secondary | ICD-10-CM

## 2011-10-21 DIAGNOSIS — R1031 Right lower quadrant pain: Secondary | ICD-10-CM | POA: Insufficient documentation

## 2011-10-21 DIAGNOSIS — R112 Nausea with vomiting, unspecified: Secondary | ICD-10-CM | POA: Insufficient documentation

## 2011-10-21 LAB — URINALYSIS, ROUTINE W REFLEX MICROSCOPIC
Bilirubin Urine: NEGATIVE
Glucose, UA: NEGATIVE mg/dL
Hgb urine dipstick: NEGATIVE
Ketones, ur: 15 mg/dL — AB
Leukocytes, UA: NEGATIVE
Nitrite: NEGATIVE
Protein, ur: NEGATIVE mg/dL
Specific Gravity, Urine: 1.028 (ref 1.005–1.030)
Urobilinogen, UA: 1 mg/dL (ref 0.0–1.0)
pH: 8 (ref 5.0–8.0)

## 2011-10-21 LAB — CBC
HCT: 39.9 % (ref 36.0–46.0)
Hemoglobin: 13.5 g/dL (ref 12.0–15.0)
MCH: 27.1 pg (ref 26.0–34.0)
MCHC: 33.8 g/dL (ref 30.0–36.0)
MCV: 80 fL (ref 78.0–100.0)
Platelets: 271 10*3/uL (ref 150–400)
RBC: 4.99 MIL/uL (ref 3.87–5.11)
RDW: 12.9 % (ref 11.5–15.5)
WBC: 7.5 10*3/uL (ref 4.0–10.5)

## 2011-10-21 LAB — COMPREHENSIVE METABOLIC PANEL
ALT: 50 U/L — ABNORMAL HIGH (ref 0–35)
AST: 34 U/L (ref 0–37)
Albumin: 3.8 g/dL (ref 3.5–5.2)
Alkaline Phosphatase: 147 U/L — ABNORMAL HIGH (ref 39–117)
BUN: 13 mg/dL (ref 6–23)
CO2: 25 mEq/L (ref 19–32)
Calcium: 10.2 mg/dL (ref 8.4–10.5)
Chloride: 101 mEq/L (ref 96–112)
Creatinine, Ser: 0.4 mg/dL — ABNORMAL LOW (ref 0.50–1.10)
GFR calc Af Amer: >90 mL/min (ref >90)
GFR calc non Af Amer: >90 mL/min (ref >90)
Glucose, Bld: 110 mg/dL — ABNORMAL HIGH (ref 70–99)
Potassium: 3.7 mEq/L (ref 3.5–5.1)
Sodium: 138 mEq/L (ref 135–145)
Total Bilirubin: 0.3 mg/dL (ref 0.3–1.2)
Total Protein: 7.5 g/dL (ref 6.0–8.3)

## 2011-10-21 LAB — DIFFERENTIAL
Basophils Absolute: 0 10*3/uL (ref 0.0–0.1)
Basophils Relative: 0 % (ref 0–1)
Eosinophils Absolute: 0.2 10*3/uL (ref 0.0–0.7)
Eosinophils Relative: 2 % (ref 0–5)
Lymphocytes Relative: 33 % (ref 12–46)
Lymphs Abs: 2.5 10*3/uL (ref 0.7–4.0)
Monocytes Absolute: 0.7 10*3/uL (ref 0.1–1.0)
Monocytes Relative: 9 % (ref 3–12)
Neutro Abs: 4.2 10*3/uL (ref 1.7–7.7)
Neutrophils Relative %: 56 % (ref 43–77)

## 2011-10-21 LAB — PREGNANCY, URINE: Preg Test, Ur: NEGATIVE

## 2011-10-21 LAB — LIPASE, BLOOD: Lipase: 16 U/L (ref 11–59)

## 2011-10-21 MED ORDER — ONDANSETRON 8 MG PO TBDP
8.0000 mg | ORAL_TABLET | Freq: Once | ORAL | Status: AC
  Administered 2011-10-21: 8 mg via ORAL
  Filled 2011-10-21: qty 1

## 2011-10-21 MED ORDER — ONDANSETRON HCL 4 MG/2ML IJ SOLN
INTRAMUSCULAR | Status: AC
  Administered 2011-10-21: 4 mg via INTRAVENOUS
  Filled 2011-10-21: qty 2

## 2011-10-21 MED ORDER — ONDANSETRON HCL 4 MG/2ML IJ SOLN
4.0000 mg | Freq: Once | INTRAMUSCULAR | Status: AC
  Administered 2011-10-21: 4 mg via INTRAVENOUS

## 2011-10-21 MED ORDER — SODIUM CHLORIDE 0.9 % IV BOLUS (SEPSIS)
1000.0000 mL | Freq: Once | INTRAVENOUS | Status: AC
  Administered 2011-10-21: 1000 mL via INTRAVENOUS

## 2011-10-21 MED ORDER — ONDANSETRON 8 MG PO TBDP: 8.0000 mg | ORAL_TABLET | Freq: Three times a day (TID) | ORAL | Status: AC | PRN

## 2011-10-21 MED ORDER — MORPHINE SULFATE 4 MG/ML IJ SOLN
4.0000 mg | Freq: Once | INTRAMUSCULAR | Status: AC
  Administered 2011-10-21: 4 mg via INTRAVENOUS
  Filled 2011-10-21: qty 1

## 2011-10-21 NOTE — ED Notes (Signed)
Pt states she is having RLQ abdominal pain.  Was seen here yesterday for same.  Pt unable to take medications at home due to N/V.  Pt states she is also having a rash from medications.

## 2011-10-21 NOTE — ED Notes (Signed)
metroNIDAZOLE (FLAGYL) 500 MG tablet Take 1 tablet (500 mg total) by mouth 2 (two) times daily. 20 tablet 10/19/2011 10/29/2011 Teressa Lower, NP ciprofloxacin (CIPRO) 500 MG tablet Take 1 tablet (500 mg total) by mouth 2 (two) times daily. 20 tablet 10/19/2011 10/29/2011 Teressa Lower, NP oxyCODONE-acetaminophen (PERCOCET) 5-325 MG per tablet Take 2 tablets by mouth every 4 (four) hours as needed for pain. 15 tablet 10/19/2011 10/29/2011 Teressa Lower, NP

## 2011-10-21 NOTE — ED Provider Notes (Addendum)
History     CSN: [id number] Arrival date & time: 10/21/2011 10:27 AM   First MD Initiated Contact with Patient 10/21/11 1036      Chief Complaint  Patient presents with  . Nausea  . Emesis    (Consider location/radiation/quality/duration/timing/severity/associated sxs/prior treatment) HPI The patient is a 30 year old female who was seen yesterday for abdominal pain, nausea, timing, and diarrhea. At that time patient had workup under the name Alison Scott. Patient had complete laboratory workup including CT scan. Patient had a right ovarian cyst noted as well as some form of inflammation in her: Per her report. She was started on Cipro and Flagyl and given pain medication as well. She reports that last night she generally was feeling fine. However this morning when she took all of her antibiotics and her pain medication she began vomiting. She says she's been unable to keep anything down since. She also describes having a weird rash to the medications that is now resolved. She denies any fevers. There are no other associated or modifying factors. History reviewed. No pertinent past medical history.  Past Surgical History  Procedure Date  . Hernia repair   . Cesarean section     History reviewed. No pertinent family history.  History  Substance Use Topics  . Smoking status: Former Games developer  . Smokeless tobacco: Not on file  . Alcohol Use: Yes     occasionally    OB History    Grav Para Term Preterm Abortions TAB SAB Ect Mult Living                  Review of Systems  Constitutional: Negative.   HENT: Negative.   Eyes: Negative.   Respiratory: Negative.   Cardiovascular: Negative.   Gastrointestinal: Positive for nausea, vomiting and abdominal pain.  Genitourinary: Negative.   Musculoskeletal: Negative.   Skin: Negative.   Neurological: Negative.   Hematological: Negative.   Psychiatric/Behavioral: Negative.   All other systems reviewed and are  negative.    Allergies  Review of patient's allergies indicates no known allergies.  Home Medications   Current Outpatient Rx  Name Route Sig Dispense Refill  . OXYCODONE-ACETAMINOPHEN 10-325 MG PO TABS Oral Take 1 tablet by mouth every 4 (four) hours as needed.      Marland Kitchen ONDANSETRON 8 MG PO TBDP Oral Take 1 tablet (8 mg total) by mouth every 8 (eight) hours as needed for nausea. 20 tablet 0    BP 107/56  Pulse 78  Temp(Src) 98.5 F (36.9 C) (Oral)  SpO2 100%  LMP 10/02/2011  Physical Exam  Nursing note and vitals reviewed. Constitutional: She is oriented to person, place, and time. She appears well-developed and well-nourished. No distress.  Eyes: Conjunctivae and EOM are normal. Pupils are equal, round, and reactive to light.  Neck: Normal range of motion.  Cardiovascular: Normal rate, regular rhythm, normal heart sounds and intact distal pulses.   Pulmonary/Chest: Effort normal. No respiratory distress. She has no wheezes. She has no rales.  Abdominal: Soft. Bowel sounds are normal. She exhibits no distension. There is no tenderness. There is no rebound and no guarding.  Musculoskeletal: Normal range of motion. She exhibits no edema and no tenderness.  Neurological: She is alert and oriented to person, place, and time. No cranial nerve deficit. She exhibits normal muscle tone. Coordination normal.  Skin: Skin is warm. No rash noted. No erythema.  Psychiatric: She has a normal mood and affect.    ED Course  Procedures (including  critical care time)  Labs Reviewed  COMPREHENSIVE METABOLIC PANEL - Abnormal; Notable for the following:    Glucose, Bld 110 (*)    Creatinine, Ser 0.40 (*)    ALT 50 (*)    Alkaline Phosphatase 147 (*)    All other components within normal limits  URINALYSIS, ROUTINE W REFLEX MICROSCOPIC - Abnormal; Notable for the following:    Color, Urine AMBER (*) BIOCHEMICALS MAY BE AFFECTED BY COLOR   Ketones, ur 15 (*)    All other components within  normal limits  CBC  DIFFERENTIAL  LIPASE, BLOOD  PREGNANCY, URINE   No results found.   1. Vomiting       MDM  Patient was evaluated by myself. She was hemodynamically stable. She was given 1 L of normal saline IV bolus as well as morphine 4 mg and Zofran 4 mg IV. Patient had repeat of her laboratory testing. Her liver function was actually improved. She continued to demonstrate no leukocytosis. Her urine also was remarkable only for small amount of ketones. Patient's results of the previous day were reviewed. She did have findings concerning for an enteritis with inflammation around the duodenum that was seen on yesterday's study. She also had an ovarian cyst noted. I spoke to the radiologist regarding the patient's study. He did feel like her symptoms could simply be the result of gastroenteritis. Patient also had evidence of clue cells on her wet prep. She reported that she did not have any concerning vaginal discharge in her estimation. She had undergone pelvic exam yesterday. Patient complained of no epigastric pain which was where the inflammation was noted on her CAT scan. She did complain of right lower quadrant pain. Though this was for her ovarian cyst was noted patient did not have an acute abdomen. Also when I spoke with radiologist this is noted to be a very small area that he might have interpreted as only a follicle. Patient was much improved after therapy. Given her symptoms with taking the antibiotics today as well as review of the film I did not think that she needs to continue her antibiotics. We discussed that she could discontinue these. She was told if she develops fevers or worsening of her symptoms she could consider restarting them. Patient was given a prescription for Zofran ODT. She was able to tolerate oral intake prior to discharge was discharged home in good condition.        Cyndra Numbers, MD 10/21/11 1556  Cyndra Numbers, MD 10/21/11 1558

## 2011-10-30 ENCOUNTER — Encounter (HOSPITAL_BASED_OUTPATIENT_CLINIC_OR_DEPARTMENT_OTHER): Payer: Self-pay | Admitting: Emergency Medicine

## 2012-02-03 ENCOUNTER — Emergency Department (HOSPITAL_BASED_OUTPATIENT_CLINIC_OR_DEPARTMENT_OTHER)
Admission: EM | Admit: 2012-02-03 | Discharge: 2012-02-03 | Disposition: A | Discharge status: Home / Self Care | Payer: No Typology Code available for payment source | Attending: Emergency Medicine | Admitting: Emergency Medicine

## 2012-02-03 ENCOUNTER — Encounter (HOSPITAL_BASED_OUTPATIENT_CLINIC_OR_DEPARTMENT_OTHER): Payer: Self-pay

## 2012-02-03 ENCOUNTER — Emergency Department (HOSPITAL_BASED_OUTPATIENT_CLINIC_OR_DEPARTMENT_OTHER): Payer: No Typology Code available for payment source

## 2012-02-03 ENCOUNTER — Emergency Department (INDEPENDENT_AMBULATORY_CARE_PROVIDER_SITE_OTHER): Payer: No Typology Code available for payment source

## 2012-02-03 DIAGNOSIS — M542 Cervicalgia: Secondary | ICD-10-CM | POA: Insufficient documentation

## 2012-02-03 DIAGNOSIS — Y92410 Unspecified street and highway as the place of occurrence of the external cause: Secondary | ICD-10-CM | POA: Insufficient documentation

## 2012-02-03 DIAGNOSIS — R51 Headache: Secondary | ICD-10-CM | POA: Insufficient documentation

## 2012-02-03 DIAGNOSIS — M25519 Pain in unspecified shoulder: Secondary | ICD-10-CM | POA: Insufficient documentation

## 2012-02-03 MED ORDER — HYDROCODONE-ACETAMINOPHEN 5-325 MG PO TABS
2.0000 | ORAL_TABLET | Freq: Once | ORAL | Status: AC
  Administered 2012-02-03: 2 via ORAL
  Filled 2012-02-03: qty 2

## 2012-02-03 MED ORDER — HYDROCODONE-ACETAMINOPHEN 5-325 MG PO TABS: 2.0000 | ORAL_TABLET | ORAL | Status: AC | PRN

## 2012-02-03 NOTE — ED Notes (Signed)
Assisted patient to ambulate to walk to restroom, patient wearing c-collar

## 2012-02-03 NOTE — ED Notes (Signed)
Called into room by primary RN.  Pt extremely upset with re: to wait time and time for xray results.  Explained to pt that we have been very busy today and I apologized for any wait time with re: to xray.  Explained to pt that xrays are read digitally by main campus and depending on severity of cases there the radiologist could be backed up.  Pt was ok with all told her and expressed thanks for update.  Pt is now ready for discharge.
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
## 2012-02-03 NOTE — ED Notes (Signed)
Patient refused to have a shoulder xray done. Stated that she didn't feel like she needed it to be done.

## 2012-02-23 ENCOUNTER — Emergency Department (INDEPENDENT_AMBULATORY_CARE_PROVIDER_SITE_OTHER)
Admission: EM | Admit: 2012-02-23 | Discharge: 2012-02-23 | Disposition: A | Discharge status: Home / Self Care | Payer: BC Managed Care – PPO | Source: Home / Self Care | Attending: Emergency Medicine | Admitting: Emergency Medicine

## 2012-02-23 ENCOUNTER — Emergency Department (INDEPENDENT_AMBULATORY_CARE_PROVIDER_SITE_OTHER): Payer: No Typology Code available for payment source

## 2012-02-23 ENCOUNTER — Encounter (HOSPITAL_COMMUNITY): Payer: Self-pay

## 2012-02-23 DIAGNOSIS — S60222A Contusion of left hand, initial encounter: Secondary | ICD-10-CM

## 2012-02-23 DIAGNOSIS — S60229A Contusion of unspecified hand, initial encounter: Secondary | ICD-10-CM

## 2012-02-23 MED ORDER — TRAMADOL HCL 50 MG PO TABS: 100.0000 mg | ORAL_TABLET | Freq: Three times a day (TID) | ORAL | Status: AC | PRN

## 2012-02-23 MED ORDER — MELOXICAM 15 MG PO TABS: 15.0000 mg | ORAL_TABLET | Freq: Every day | ORAL | Status: DC
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
## 2012-02-23 NOTE — ED Notes (Addendum)
Patient c/o pain in her let hand, 4th finger, PIP joint pain and swelling, unable to remove ring (removed and returned to patient for her safekeeping) no known recent injury to her left hand; arrived with hand wrapped tightly in coban type bandage, clutching wrist, c/o pain worse w any wrist movement

## 2012-02-23 NOTE — Discharge Instructions (Signed)

## 2012-06-04 ENCOUNTER — Emergency Department (HOSPITAL_COMMUNITY)
Admission: EM | Admit: 2012-06-04 | Discharge: 2012-06-04 | Disposition: A | Discharge status: Home / Self Care | Payer: BC Managed Care – PPO | Attending: Emergency Medicine | Admitting: Emergency Medicine

## 2012-06-04 ENCOUNTER — Encounter (HOSPITAL_COMMUNITY): Payer: Self-pay | Admitting: Emergency Medicine

## 2012-06-04 DIAGNOSIS — E049 Nontoxic goiter, unspecified: Secondary | ICD-10-CM

## 2012-06-04 DIAGNOSIS — Z87891 Personal history of nicotine dependence: Secondary | ICD-10-CM | POA: Insufficient documentation

## 2012-06-04 DIAGNOSIS — E0590 Thyrotoxicosis, unspecified without thyrotoxic crisis or storm: Secondary | ICD-10-CM

## 2012-06-04 DIAGNOSIS — E0500 Thyrotoxicosis with diffuse goiter without thyrotoxic crisis or storm: Secondary | ICD-10-CM | POA: Insufficient documentation

## 2012-06-04 HISTORY — DX: Thyrotoxicosis, unspecified without thyrotoxic crisis or storm: E05.90

## 2012-06-04 LAB — T3: T3, Total: 495.8 ng/dl — ABNORMAL HIGH (ref 80.0–204.0)

## 2012-06-04 LAB — TSH: TSH: <0.008 u[IU]/mL — ABNORMAL LOW (ref 0.350–4.500)

## 2012-06-04 LAB — T4, FREE: Free T4: 4.55 ng/dL — ABNORMAL HIGH (ref 0.80–1.80)

## 2012-06-04 NOTE — Discharge Instructions (Signed)
Please read and follow all provided instructions.  Your diagnoses today include:  1. Goiter   2. Hyperthyroidism     Tests performed today include:  Vital signs. See below for your results today.   Thyroid functions - results not back but have been performed so they will be done when you see the surgeon  Medications prescribed:   None  Home care instructions:  Follow any educational materials contained in this packet.  Continue the medications prescribed by your doctor.  Follow-up instructions: Please follow-up with the surgery referral in the next 3 days for further evaluation of your symptoms.  If you do not have a primary care doctor -- see below forreferral information.   Return instructions:   Please return to the Emergency Department if you experience worsening symptoms.   Please return if you have any other emergent concerns.  Additional Information:  Your vital signs today were: BP 122/79  Pulse 88  Temp 98 F (36.7 C) (Oral)  Resp 16  SpO2 100% If your blood pressure (BP) was elevated above 135/85 this visit, please have this repeated by your doctor within one month. -------------- No Primary Care Doctor Call [facility]  [phone] Other agencies that provide inexpensive medical care    [facility]  [phone]    [facility]  [phone]    [facility]  [phone]    [facility]  [phone]    [facility]  [phone]    [facility]  [phone] -------------- RESOURCE GUIDE:  Dental Problems  Patients with Medicaid: [facility] [phone] [address]                                            [id number] Phone:  [phone]                                                   Phone:  [phone]  If unable to pay or uninsured, contact:  [facility] or [facility] to become qualified for the adult dental clinic.  Chronic Pain  Problems Contact [facility]  [phone] Patients need to be referred by their primary care doctor.  Insufficient Money for Medicine Contact [employer]:  call "211" or [facility] [phone].  Psychological Services [facility]  [phone] [facility]  [phone] [facility]   [phone] (emergency services [phone])  Substance Abuse Resources [facility]  [phone] [facility] 4247542384 [facility] [place] [phone] Floydene Flock [phone] Residential & Outpatient Substance Abuse Program  [phone]  Abuse/Neglect [place] Child Abuse Hotline [phone] [facility] Child Abuse Hotline [phone] (After Hours)  Emergency Shelter [facility] [phone]  Maternity Homes Room at the [place] [facility] [place] [phone] [facility] [phone]  [facility] [place]  [facility] [address] [place]                       [address]      [address]  [place]                                                [place]                            [place] Phone:  [phone]                                   Phone:  [phone]                 Phone:  [phone]  [place] [facility] Phone:  [phone]  [place] Child Abuse Hotline [phone]) [phone] (After Hours)    

## 2012-06-04 NOTE — ED Provider Notes (Signed)
History     CSN: [id number]  Arrival date & time 06/04/12  1138   First MD Initiated Contact with Patient 06/04/12 1338      Chief Complaint  Patient presents with  . Hyperthyroidism    (Consider location/radiation/quality/duration/timing/severity/associated sxs/prior treatment) HPI Comments: Patient with history of hyperthyroidism diagnosed 3 weeks ago presents with complaint of neck swelling. Patient described a 'pulling' sensation in neck but denies shortness of breath or trouble breathing. Patient was dx after having HR to 130 and hot flashes. She has PCP and endocrinologist. She was told to come to ED today by endocrinologist for worsening neck swelling. She has been placed on beta blocker and methimazole. No fever, N/V. Patient reports several pound weight loss. Onset was gradual. Course is gradually worsening. Nothing makes symptoms better or worse.    The history is provided by the patient.    Past Medical History  Diagnosis Date  . Hyperthyroidism     Past Surgical History  Procedure Date  . Hernia repair   . Cesarean section     History reviewed. No pertinent family history.  History  Substance Use Topics  . Smoking status: Former Games developer  . Smokeless tobacco: Not on file  . Alcohol Use: Yes     occasionally    OB History    Grav Para Term Preterm Abortions TAB SAB Ect Mult Living                  Review of Systems  Constitutional: Positive for unexpected weight change. Negative for fever.  HENT: Negative for sore throat, trouble swallowing, neck pain and neck stiffness.   Eyes: Negative for redness.  Respiratory: Negative for cough, shortness of breath and stridor.   Cardiovascular: Negative for chest pain and palpitations.  Gastrointestinal: Negative for nausea, vomiting, abdominal pain and diarrhea.  Musculoskeletal: Negative for myalgias.  Skin: Negative for rash.  Neurological: Negative for headaches.  Hematological: Negative for adenopathy.     Allergies  Percocet  Home Medications   Current Outpatient Rx  Name Route Sig Dispense Refill  . METHIMAZOLE 10 MG PO TABS Oral Take 30 mg by mouth daily.    Marland Kitchen NADOLOL 20 MG PO TABS Oral Take 20 mg by mouth daily.      BP 120/73  Pulse 91  Temp 98 F (36.7 C) (Oral)  Resp 16  SpO2 100%  Physical Exam  Nursing note and vitals reviewed. Constitutional: She appears well-developed and well-nourished.  HENT:  Head: Normocephalic and atraumatic.  Eyes: Conjunctivae are normal. Right eye exhibits no discharge. Left eye exhibits no discharge.       No proptosis  Neck: Normal range of motion. Neck supple. Thyromegaly (diffuse mild enlargement bilaterally without nodules) present.       Normal tracheal breath sounds  Cardiovascular: Normal rate, regular rhythm and normal heart sounds.   Pulmonary/Chest: Effort normal and breath sounds normal. No respiratory distress. She has no wheezes.  Abdominal: Soft. There is no tenderness.  Neurological: She is alert.  Skin: Skin is warm and dry.  Psychiatric: She has a normal mood and affect.    ED Course  Procedures (including critical care time)   Labs Reviewed  TSH  T4, FREE  T3   No results found.   1. Goiter   2. Hyperthyroidism     2:39 PM Patient seen and examined.   Vital signs reviewed and are as follows: Filed Vitals:   06/04/12 1430  BP: 120/73  Pulse: 91  Temp:   Resp: 16   Patient was discussed with Dr. Weldon Inches who has seen patient. He has spoken with gen surg to arrange follow-up. Thyroid studies orders so surgeon will have results at follow-up. Patient informed. She agrees with plan and is ready for discharge.    MDM  Patient with goiter. There is no evidence of airway compromise. There is no evidence of thyrotoxicosis or thyroid storm. Surgery follow-up for removal is arranged. Referral information given.        [place], [place] 06/04/12 [phone]

## 2012-06-04 NOTE — ED Notes (Signed)
Pt c/o hyperthyroidism and is having swelling and thyroid gland; pt sts can not have sx to remove yet due to issues with tachycardia; pt sts feels like becoming hard to breath due to swelling; pt handling secretions, O2 sats 100% and speaking complete sentences; pt HR 88 bpm at present

## 2012-06-04 NOTE — ED Notes (Signed)
Patient with complaints of heavy vaginal bleeding over the past 1 week.

## 2012-06-04 NOTE — ED Notes (Signed)
Patient spouse reports he has noticed the patient has had irregular breathing patterns during her sleep,  She had elevated heartrate at her md office as well

## 2012-06-04 NOTE — ED Provider Notes (Addendum)
Medical screening examination/treatment/procedure(s) were conducted as a shared visit with non-physician practitioner(s) and myself.  I personally evaluated the patient during the encounter Pt has known hyperthyroidism.  Recently started on methimazole.  No tachycardia. No resp distress.   Spoke with her endocrinologist.  She agrees with referral to surgery but pt needs normal T3T4 prior to surgery.  I explained this to pt.   She understands.  Will refer to surgery.    Cheri Guppy, MD 06/04/12 1506  Spoke with Surgery.  They said to have her f/u with Dr. Darnell Level.    Cheri Guppy, MD 06/04/12 [phone]

## 2012-06-05 NOTE — ED Provider Notes (Signed)
Medical screening examination/treatment/procedure(s) were conducted as a shared visit with non-physician practitioner(s) and myself.  I personally evaluated the patient during the encounter  Cheri Guppy, MD 06/05/12 1536

## 2017-03-16 ENCOUNTER — Emergency Department (HOSPITAL_COMMUNITY)
Admission: EM | Admit: 2017-03-16 | Discharge: 2017-03-16 | Disposition: A | Discharge status: Home / Self Care | Payer: Managed Care, Other (non HMO) | Attending: Emergency Medicine | Admitting: Emergency Medicine

## 2017-03-16 ENCOUNTER — Encounter (HOSPITAL_COMMUNITY): Payer: Self-pay | Admitting: Emergency Medicine

## 2017-03-16 DIAGNOSIS — T63331A Toxic effect of venom of brown recluse spider, accidental (unintentional), initial encounter: Secondary | ICD-10-CM | POA: Insufficient documentation

## 2017-03-16 DIAGNOSIS — Z87891 Personal history of nicotine dependence: Secondary | ICD-10-CM | POA: Insufficient documentation

## 2017-03-16 MED ORDER — HYDROCODONE-ACETAMINOPHEN 5-325 MG PO TABS
2.0000 | ORAL_TABLET | Freq: Once | ORAL | Status: AC
  Administered 2017-03-16: 2 via ORAL
  Filled 2017-03-16: qty 2

## 2017-03-16 MED ORDER — IBUPROFEN 600 MG PO TABS: 600.0000 mg | ORAL_TABLET | Freq: Four times a day (QID) | ORAL | 0 refills | Status: DC | PRN

## 2017-03-16 MED ORDER — DEXAMETHASONE SODIUM PHOSPHATE 10 MG/ML IJ SOLN
10.0000 mg | Freq: Once | INTRAMUSCULAR | Status: AC
  Administered 2017-03-16: 10 mg via INTRAMUSCULAR
  Filled 2017-03-16: qty 1

## 2017-03-16 MED ORDER — IBUPROFEN 800 MG PO TABS
800.0000 mg | ORAL_TABLET | Freq: Once | ORAL | Status: AC
  Administered 2017-03-16: 800 mg via ORAL
  Filled 2017-03-16: qty 1

## 2017-03-16 MED ORDER — HYDROCODONE-ACETAMINOPHEN 5-325 MG PO TABS: 1.0000 | ORAL_TABLET | Freq: Four times a day (QID) | ORAL | 0 refills | Status: DC | PRN

## 2017-03-16 MED ORDER — OXYCODONE-ACETAMINOPHEN 5-325 MG PO TABS
2.0000 | ORAL_TABLET | Freq: Once | ORAL | Status: DC
  Filled 2017-03-16: qty 2

## 2017-03-16 NOTE — ED Provider Notes (Signed)
[place] DEPT Provider Note   CSN: [id number] Arrival date & time: 03/16/17  1002    History   Chief Complaint Chief Complaint  Patient presents with  . Insect Bite    HPI Alison Scott is a 36 y.o. female.  36 year old female with no significant past medical history presents to the emergency department for evaluation of a rash to her inner thighs. She began noticing symptoms yesterday after walking around the construction site of her home. She denies feeling as though something bit her, but noticed some discomfort about one hour after leaving. She took a Benadryl for symptoms last night without relief. Patient reports waking this morning noticing worsening of redness. She is complaining of a burning discomfort which is worse with ambulation. She states that the area is very tender. She has not had any fevers, lower leg swelling, extremity numbness, or weakness. No new soaps, lotions, or detergents. No medications taken prior to arrival for pain.   The history is provided by the patient. No language interpreter was used.    Past Medical History:  Diagnosis Date  . Hyperthyroidism     Patient Active Problem List   Diagnosis Date Noted  . Hyperthyroidism     Past Surgical History:  Procedure Laterality Date  . CESAREAN SECTION    . HERNIA REPAIR      OB History    No data available       Home Medications    Prior to Admission medications   Medication Sig Start Date End Date Taking? Authorizing Provider  diphenhydrAMINE (BENADRYL) 25 MG tablet Take 50 mg by mouth every 6 (six) hours as needed for allergies.   Yes Historical Provider, MD  levothyroxine (SYNTHROID, LEVOTHROID) 150 MCG tablet Take 150 mcg by mouth every morning. 01/28/17  Yes Historical Provider, MD  HYDROcodone-acetaminophen (NORCO/VICODIN) 5-325 MG tablet Take 1-2 tablets by mouth every 6 (six) hours as needed. 03/16/17   Antonietta Breach, PA-C  ibuprofen (ADVIL,MOTRIN) 600 MG tablet Take 1  tablet (600 mg total) by mouth every 6 (six) hours as needed. 03/16/17   Antonietta Breach, PA-C    Family History No family history on file.  Social History Social History  Substance Use Topics  . Smoking status: Former Research scientist (life sciences)  . Smokeless tobacco: Not on file  . Alcohol use Yes     Comment: occasionally     Allergies   Percocet [oxycodone-acetaminophen]   Review of Systems Review of Systems Ten systems reviewed and are negative for acute change, except as noted in the HPI.    Physical Exam Updated Vital Signs BP 112/80   Pulse 71   Temp 98.8 F (37.1 C) (Oral)   Resp 16   LMP 02/04/2017 (Approximate)   SpO2 100%   Physical Exam  Constitutional: She is oriented to person, place, and time. She appears well-developed and well-nourished. No distress.  Nontoxic and in NAD  HENT:  Head: Normocephalic and atraumatic.  Eyes: Conjunctivae and EOM are normal. No scleral icterus.  Neck: Normal range of motion.  Cardiovascular: Normal rate, regular rhythm and intact distal pulses.   DP pulse 2+ bilaterally  Pulmonary/Chest: Effort normal. No respiratory distress.  Respirations even and unlabored  Musculoskeletal: Normal range of motion.  No lower extremity pitting edema.  Neurological: She is alert and oriented to person, place, and time. She exhibits normal muscle tone. Coordination normal.  GCS 15. Patient moving all extremities.  Skin: Skin is warm and dry. Rash noted. She is  not diaphoretic. There is erythema. No pallor.  Macular rash to bilateral inner thighs. Area is erythematous, tender, blanching. Mildly ecchymotic. No red linear streaking. No induration or fluctuance.  Psychiatric: She has a normal mood and affect. Her behavior is normal.  Nursing note and vitals reviewed.    ED Treatments / Results  Labs (all labs ordered are listed, but only abnormal results are displayed) Labs Reviewed - No data to display  EKG  EKG Interpretation None        Radiology No results found.  Procedures Procedures (including critical care time)  Medications Ordered in ED Medications  HYDROcodone-acetaminophen (NORCO/VICODIN) 5-325 MG per tablet 2 tablet (not administered)  ibuprofen (ADVIL,MOTRIN) tablet 800 mg (800 mg Oral Given 03/16/17 1249)  dexamethasone (DECADRON) injection 10 mg (10 mg Intramuscular Given 03/16/17 1249)     [name] inner thighs   ^left inner thigh   2:05 PM Case discussed between Dr. Johnney Killian and Dr. Theodoro Kos of plastic surgery. Dr. Migdalia Dk recommends pain control and office follow up next week. Plan to discharge with Norco and ibuprofen. Return precautions provided.   Initial Impression / Assessment and Plan / ED Course  I have reviewed the triage vital signs and the nursing notes.  Pertinent labs & imaging results that were available during my care of the patient were reviewed by me and considered in my medical decision making (see chart for details).     36 year old female presents to the emergency department for evaluation of a rash to her in her thighs which is painful. There is a small vesicle to the central area of the macule on the left inner thigh. Area is blanching, but also slightly ecchymotic. Degree of pain and physical exam findings concerning for possible brown recluse spider bite.  Case discussed with Dr. Migdalia Dk of plastic surgery. She will see the patient in the office for wound recheck this coming week. Dr. Migdalia Dk advises pain control in the interim. Patient placed on NSAIDs as well as Norco. Return precautions discussed and provided. Patient discharged in stable condition with no unaddressed concerns.   Final Clinical Impressions(s) / ED Diagnoses   Final diagnoses:  Brown recluse spider bite or sting, accidental or unintentional, initial encounter    New Prescriptions New Prescriptions   HYDROCODONE-ACETAMINOPHEN (NORCO/VICODIN) 5-325 MG TABLET    Take 1-2 tablets by mouth every  6 (six) hours as needed.   IBUPROFEN (ADVIL,MOTRIN) 600 MG TABLET    Take 1 tablet (600 mg total) by mouth every 6 (six) hours as needed.     Antonietta Breach, PA-C 03/16/17 1422    Charlesetta Shanks, MD 03/18/17 [phone]

## 2017-03-16 NOTE — ED Triage Notes (Signed)
Pt thinks she may have been bitten by something yesterday, states she was outside wearing a dress, pt has redness and swelling to both inner thighs. Denies fever.

## 2017-03-16 NOTE — Discharge Instructions (Signed)
Follow up with Dr. Migdalia Dk in the office for monitoring of your rash. Take ibuprofen for pain. You may supplement this with Norco, as needed. Try applying ice packs to limit pain/swelling. You may return to the ED if you develop a fever or if your skin starts to become necrotic/ulcerative.

## 2017-03-16 NOTE — ED Notes (Signed)
PT made aware that she cannot drive or operate machinery for the rest of the day or while continuing to take hydrocodone for pain.

## 2017-03-16 NOTE — ED Notes (Signed)
Called 3x for room and no answer

## 2017-07-17 ENCOUNTER — Emergency Department (HOSPITAL_COMMUNITY)
Admission: EM | Admit: 2017-07-17 | Discharge: 2017-07-17 | Disposition: A | Discharge status: Home / Self Care | Payer: Managed Care, Other (non HMO) | Attending: Emergency Medicine | Admitting: Emergency Medicine

## 2017-07-17 ENCOUNTER — Emergency Department (HOSPITAL_COMMUNITY): Payer: Managed Care, Other (non HMO)

## 2017-07-17 DIAGNOSIS — R102 Pelvic and perineal pain unspecified side: Secondary | ICD-10-CM

## 2017-07-17 DIAGNOSIS — Z87891 Personal history of nicotine dependence: Secondary | ICD-10-CM | POA: Diagnosis not present

## 2017-07-17 DIAGNOSIS — E039 Hypothyroidism, unspecified: Secondary | ICD-10-CM | POA: Insufficient documentation

## 2017-07-17 DIAGNOSIS — N939 Abnormal uterine and vaginal bleeding, unspecified: Secondary | ICD-10-CM

## 2017-07-17 DIAGNOSIS — D259 Leiomyoma of uterus, unspecified: Secondary | ICD-10-CM | POA: Diagnosis not present

## 2017-07-17 LAB — GC/CHLAMYDIA PROBE AMP (~~LOC~~) NOT AT ARMC
Chlamydia: NEGATIVE
Neisseria Gonorrhea: NEGATIVE

## 2017-07-17 LAB — URINALYSIS, ROUTINE W REFLEX MICROSCOPIC
Bacteria, UA: NONE SEEN
Bilirubin Urine: NEGATIVE
Glucose, UA: NEGATIVE mg/dL
Ketones, ur: NEGATIVE mg/dL
Leukocytes, UA: NEGATIVE
Nitrite: NEGATIVE
Protein, ur: NEGATIVE mg/dL
Specific Gravity, Urine: 1.019 (ref 1.005–1.030)
pH: 6 (ref 5.0–8.0)

## 2017-07-17 LAB — WET PREP, GENITAL
Clue Cells Wet Prep HPF POC: NONE SEEN
Sperm: NONE SEEN
Trich, Wet Prep: NONE SEEN
Yeast Wet Prep HPF POC: NONE SEEN

## 2017-07-17 LAB — POC URINE PREG, ED: Preg Test, Ur: NEGATIVE

## 2017-07-17 MED ORDER — MORPHINE SULFATE (PF) 4 MG/ML IV SOLN
4.0000 mg | Freq: Once | INTRAVENOUS | Status: AC
Start: 2017-07-17 — End: 2017-07-17
  Administered 2017-07-17: 4 mg via INTRAVENOUS
  Filled 2017-07-17: qty 1

## 2017-07-17 MED ORDER — ONDANSETRON HCL 4 MG/2ML IJ SOLN
4.0000 mg | Freq: Once | INTRAMUSCULAR | Status: AC
  Administered 2017-07-17: 4 mg via INTRAVENOUS
  Filled 2017-07-17: qty 2

## 2017-07-17 MED ORDER — HYDROCODONE-ACETAMINOPHEN 5-325 MG PO TABS: 1.0000 | ORAL_TABLET | Freq: Four times a day (QID) | ORAL | 0 refills | Status: DC | PRN

## 2017-07-17 MED ORDER — KETOROLAC TROMETHAMINE 30 MG/ML IJ SOLN
30.0000 mg | Freq: Once | INTRAMUSCULAR | Status: AC
  Administered 2017-07-17: 30 mg via INTRAMUSCULAR
  Filled 2017-07-17: qty 1

## 2017-07-17 NOTE — Discharge Instructions (Signed)
You have a fibroid in your uterus that is likely causing heavier menstrual bleeding and cramping.  Please call your gynecologist for follow-up to discuss further management, as needed.  Return without fail for worsening symptoms, including passing out, extreme fatigue or shortness of breath, worsening bleeding, (> 1 super pad per hour), or any other symptoms concerning to you.  Continue ibuprofen 600-800 mg every 6-8 hours as needed, heating pads, and only use Vicodin for breakthrough pain.

## 2017-07-17 NOTE — ED Provider Notes (Signed)
[facility] DEPT Provider Note   CSN: [id number] Arrival date & time: 07/17/17  6759     History   Chief Complaint Chief Complaint  Patient presents with  . Vaginal Bleeding    HPI Alison Scott is a 36 y.o. female.  The history is provided by the patient.  Vaginal Bleeding  Primary symptoms include pelvic pain, vaginal bleeding.  Primary symptoms include no discharge, no dysuria. There has been no fever. This is a new problem. The current episode started 1 to 2 hours ago. The problem occurs constantly. The problem has been gradually worsening. The symptoms occur spontaneously. She is not pregnant. She has not missed her period. The patient's menstrual history has been regular. The discharge was normal. Associated symptoms include abdominal pain and nausea. Pertinent negatives include no constipation, no vomiting and no frequency. She has tried nothing for the symptoms. Sexual activity: sexually active. She uses nothing for contraception.   36 year old female who presents with vaginal bleeding and cramping that started this morning. Normally with light and short periods and she states this is unusual. C/o nausea but no vomiting, diarrhea, or urinary complaints. No fever or chills.   Past Medical History:  Diagnosis Date  . Hyperthyroidism     Patient Active Problem List   Diagnosis Date Noted  . Hyperthyroidism     Past Surgical History:  Procedure Laterality Date  . CESAREAN SECTION    . HERNIA REPAIR      OB History    No data available       Home Medications    Prior to Admission medications   Medication Sig Start Date End Date Taking? Authorizing Provider  levothyroxine (SYNTHROID, LEVOTHROID) 150 MCG tablet Take 150 mcg by mouth every morning. 01/28/17  Yes [provider]  HYDROcodone-acetaminophen (NORCO/VICODIN) 5-325 MG tablet Take 1-2 tablets by mouth every 6 (six) hours as needed. Patient not taking: Reported on 07/17/2017 03/16/17    Antonietta Breach, PA-C  HYDROcodone-acetaminophen (NORCO/VICODIN) 5-325 MG tablet Take 1-2 tablets by mouth every 6 (six) hours as needed for moderate pain or severe pain. 07/17/17   Forde Dandy, MD  ibuprofen (ADVIL,MOTRIN) 600 MG tablet Take 1 tablet (600 mg total) by mouth every 6 (six) hours as needed. Patient not taking: Reported on 07/17/2017 03/16/17   Antonietta Breach, PA-C    Family History No family history on file.  Social History Social History  Substance Use Topics  . Smoking status: Former Research scientist (life sciences)  . Smokeless tobacco: Not on file  . Alcohol use Yes     Comment: occasionally     Allergies   Percocet [oxycodone-acetaminophen]   Review of Systems Review of Systems  Gastrointestinal: Positive for abdominal pain and nausea. Negative for constipation and vomiting.  Genitourinary: Positive for pelvic pain and vaginal bleeding. Negative for dysuria and frequency.  All other systems reviewed and are negative.    Physical Exam Updated Vital Signs BP 132/75   Pulse (!) 51   Temp 98 F (36.7 C) (Oral)   Resp 16   LMP 07/17/2017 (Exact Date)   SpO2 97%   Physical Exam Physical Exam  Nursing note and vitals reviewed. Constitutional: Well developed, well nourished, non-toxic, and in no acute distress Head: Normocephalic and atraumatic.  Mouth/Throat: Oropharynx is clear and moist.  Neck: Normal range of motion. Neck supple.  Cardiovascular: Normal rate and regular rhythm.   Pulmonary/Chest: Effort normal and breath sounds normal.  Abdominal: Soft. There is low abdominal/pelvic tenderness. There  is no rebound and no guarding.  Musculoskeletal: Normal range of motion.  Neurological: Alert, no facial droop, fluent speech, moves all extremities symmetrically Skin: Skin is warm and dry.  Psychiatric: Cooperative Pelvic: Normal external genitalia. Normal internal genitalia. No discharge. No blood within the vagina. No cervical motion tenderness. No adnexal masses. Right adnexal  and suprapubic pain.    ED Treatments / Results  Labs (all labs ordered are listed, but only abnormal results are displayed) Labs Reviewed  WET PREP, GENITAL - Abnormal; Notable for the following:       Result Value   WBC, Wet Prep HPF POC FEW (*)    All other components within normal limits  URINALYSIS, ROUTINE W REFLEX MICROSCOPIC - Abnormal; Notable for the following:    Hgb urine dipstick LARGE (*)    Squamous Epithelial / LPF 0-5 (*)    All other components within normal limits  I-STAT CHEM 8, ED  POC URINE PREG, ED  GC/CHLAMYDIA PROBE AMP ([place]) NOT AT [facility]    EKG  EKG Interpretation None       Radiology US Transvaginal Non-ob  Result Date: 07/17/2017 CLINICAL DATA:  Severe right lower quadrant pain with dysfunctional uterine bleeding. EXAM: TRANSABDOMINAL AND TRANSVAGINAL ULTRASOUND OF PELVIS DOPPLER ULTRASOUND OF OVARIES TECHNIQUE: Both transabdominal and transvaginal ultrasound examinations of the pelvis were performed. Transabdominal technique was performed for global imaging of the pelvis including uterus, ovaries, adnexal regions, and pelvic cul-de-sac. Color and duplex Doppler ultrasound was utilized to evaluate blood flow to the ovaries. COMPARISON:  None. FINDINGS: Uterus Measurements: 7.3 x 5.4 x 6.1 cm. There is a 5.4 cm anterior uterine fibroid. Endometrium Thickness: 10.0 cm.  No focal abnormality visualized. Right ovary Measurements: 3.1 x 1.8 x 1.9 cm. Normal appearance/no adnexal mass. Left ovary Measurements: 3.0 x 2.0 x 1.5 cm. Normal appearance/no adnexal mass. Pulsed Doppler evaluation of both ovaries demonstrates normal low-resistance arterial and venous waveforms. Other findings No abnormal free fluid. IMPRESSION: 1. Normal appearing ovaries with normal perfusion each ovary. 2. Normal endometrium. 3. 5.4 cm fibroid in the anterior aspect of the body of the uterus. Electronically Signed   By: Lorriane Shire M.D.   On: 07/17/2017 10:44   US Pelvis  Complete  Result Date: 07/17/2017 CLINICAL DATA:  Severe right lower quadrant pain with dysfunctional uterine bleeding. EXAM: TRANSABDOMINAL AND TRANSVAGINAL ULTRASOUND OF PELVIS DOPPLER ULTRASOUND OF OVARIES TECHNIQUE: Both transabdominal and transvaginal ultrasound examinations of the pelvis were performed. Transabdominal technique was performed for global imaging of the pelvis including uterus, ovaries, adnexal regions, and pelvic cul-de-sac. Color and duplex Doppler ultrasound was utilized to evaluate blood flow to the ovaries. COMPARISON:  None. FINDINGS: Uterus Measurements: 7.3 x 5.4 x 6.1 cm. There is a 5.4 cm anterior uterine fibroid. Endometrium Thickness: 10.0 cm.  No focal abnormality visualized. Right ovary Measurements: 3.1 x 1.8 x 1.9 cm. Normal appearance/no adnexal mass. Left ovary Measurements: 3.0 x 2.0 x 1.5 cm. Normal appearance/no adnexal mass. Pulsed Doppler evaluation of both ovaries demonstrates normal low-resistance arterial and venous waveforms. Other findings No abnormal free fluid. IMPRESSION: 1. Normal appearing ovaries with normal perfusion each ovary. 2. Normal endometrium. 3. 5.4 cm fibroid in the anterior aspect of the body of the uterus. Electronically Signed   By: Lorriane Shire M.D.   On: 07/17/2017 10:44   [place] Art/ven Flow Abd Pelv Doppler  Result Date: 07/17/2017 CLINICAL DATA:  Severe right lower quadrant pain with dysfunctional uterine bleeding. EXAM: TRANSABDOMINAL AND TRANSVAGINAL ULTRASOUND  OF PELVIS DOPPLER ULTRASOUND OF OVARIES TECHNIQUE: Both transabdominal and transvaginal ultrasound examinations of the pelvis were performed. Transabdominal technique was performed for global imaging of the pelvis including uterus, ovaries, adnexal regions, and pelvic cul-de-sac. Color and duplex Doppler ultrasound was utilized to evaluate blood flow to the ovaries. COMPARISON:  None. FINDINGS: Uterus Measurements: 7.3 x 5.4 x 6.1 cm. There is a 5.4 cm anterior uterine fibroid.  Endometrium Thickness: 10.0 cm.  No focal abnormality visualized. Right ovary Measurements: 3.1 x 1.8 x 1.9 cm. Normal appearance/no adnexal mass. Left ovary Measurements: 3.0 x 2.0 x 1.5 cm. Normal appearance/no adnexal mass. Pulsed Doppler evaluation of both ovaries demonstrates normal low-resistance arterial and venous waveforms. Other findings No abnormal free fluid. IMPRESSION: 1. Normal appearing ovaries with normal perfusion each ovary. 2. Normal endometrium. 3. 5.4 cm fibroid in the anterior aspect of the body of the uterus. Electronically Signed   By: Lorriane Shire M.D.   On: 07/17/2017 10:44    Procedures Procedures (including critical care time)  Medications Ordered in ED Medications  ketorolac (TORADOL) 30 MG/ML injection 30 mg (30 mg Intramuscular Given 07/17/17 0754)  morphine 4 MG/ML injection 4 mg (4 mg Intravenous Given 07/17/17 0940)  ondansetron (ZOFRAN) injection 4 mg (4 mg Intravenous Given 07/17/17 0940)     Initial Impression / Assessment and Plan / ED Course  I have reviewed the triage vital signs and the nursing notes.  Pertinent labs & imaging results that were available during my care of the patient were reviewed by me and considered in my medical decision making (see chart for details).     This is a 34 old female who presents with the vaginal bleeding with pelvic cramping that started this morning. She is nontoxic and in no acute distress. Vital signs are stable. Pelvic exam reveals some right adnexal and suprapubic tenderness. There is small clots in the vagina, but no active bleeding. She is not pregnant. Ultrasound performed to rule out ovarian cyst versus torsion. She has normal flow to the ovaries, but does have large fibroid that is likely causing symptoms. Pain controlled ultimately with Toradol and morphine. What prep is negative. She does have a gynecologist who she will be able to follow up with. Strict return and follow-up instructions reviewed. She  expressed understanding of all discharge instructions and felt comfortable with the plan of care.   Final Clinical Impressions(s) / ED Diagnoses   Final diagnoses:  Pelvic pain  Abnormal uterine bleeding (AUB)  Uterine leiomyoma, unspecified location    New Prescriptions New Prescriptions   HYDROCODONE-ACETAMINOPHEN (NORCO/VICODIN) 5-325 MG TABLET    Take 1-2 tablets by mouth every 6 (six) hours as needed for moderate pain or severe pain.     Forde Dandy, MD 07/17/17 1146

## 2017-07-17 NOTE — ED Triage Notes (Signed)
Back pain and vaginal bleeding onset this morning. States pain "comes in waves" "like labor." States "I'm hemorrhaging."

## 2017-08-08 ENCOUNTER — Ambulatory Visit: Payer: Managed Care, Other (non HMO) | Admitting: Family Medicine

## 2017-08-15 ENCOUNTER — Ambulatory Visit: Payer: Managed Care, Other (non HMO) | Admitting: Family Medicine

## 2017-09-28 ENCOUNTER — Other Ambulatory Visit: Payer: Self-pay | Admitting: Obstetrics and Gynecology

## 2017-11-12 ENCOUNTER — Ambulatory Visit: Payer: Managed Care, Other (non HMO) | Admitting: Family Medicine

## 2017-12-01 ENCOUNTER — Emergency Department (HOSPITAL_BASED_OUTPATIENT_CLINIC_OR_DEPARTMENT_OTHER): Payer: Managed Care, Other (non HMO)

## 2017-12-01 ENCOUNTER — Other Ambulatory Visit: Payer: Self-pay

## 2017-12-01 ENCOUNTER — Emergency Department (HOSPITAL_BASED_OUTPATIENT_CLINIC_OR_DEPARTMENT_OTHER)
Admission: EM | Admit: 2017-12-01 | Discharge: 2017-12-01 | Disposition: A | Discharge status: Home / Self Care | Payer: Managed Care, Other (non HMO) | Attending: Physician Assistant | Admitting: Physician Assistant

## 2017-12-01 ENCOUNTER — Encounter (HOSPITAL_BASED_OUTPATIENT_CLINIC_OR_DEPARTMENT_OTHER): Payer: Self-pay | Admitting: *Deleted

## 2017-12-01 DIAGNOSIS — R102 Pelvic and perineal pain: Secondary | ICD-10-CM | POA: Diagnosis present

## 2017-12-01 DIAGNOSIS — N898 Other specified noninflammatory disorders of vagina: Secondary | ICD-10-CM | POA: Insufficient documentation

## 2017-12-01 DIAGNOSIS — D259 Leiomyoma of uterus, unspecified: Secondary | ICD-10-CM

## 2017-12-01 DIAGNOSIS — Z87891 Personal history of nicotine dependence: Secondary | ICD-10-CM | POA: Insufficient documentation

## 2017-12-01 DIAGNOSIS — Z79899 Other long term (current) drug therapy: Secondary | ICD-10-CM | POA: Insufficient documentation

## 2017-12-01 LAB — CBC WITH DIFFERENTIAL/PLATELET
Basophils Absolute: 0 10*3/uL (ref 0.0–0.1)
Basophils Relative: 0 %
Eosinophils Absolute: 0.1 10*3/uL (ref 0.0–0.7)
Eosinophils Relative: 1 %
HCT: 37.2 % (ref 36.0–46.0)
Hemoglobin: 12.4 g/dL (ref 12.0–15.0)
Lymphocytes Relative: 20 %
Lymphs Abs: 2.3 10*3/uL (ref 0.7–4.0)
MCH: 30.3 pg (ref 26.0–34.0)
MCHC: 33.3 g/dL (ref 30.0–36.0)
MCV: 91 fL (ref 78.0–100.0)
Monocytes Absolute: 1 10*3/uL (ref 0.1–1.0)
Monocytes Relative: 8 %
Neutro Abs: 8.1 10*3/uL — ABNORMAL HIGH (ref 1.7–7.7)
Neutrophils Relative %: 71 %
Platelets: 303 10*3/uL (ref 150–400)
RBC: 4.09 MIL/uL (ref 3.87–5.11)
RDW: 13.6 % (ref 11.5–15.5)
WBC: 11.5 10*3/uL — ABNORMAL HIGH (ref 4.0–10.5)

## 2017-12-01 LAB — URINALYSIS, ROUTINE W REFLEX MICROSCOPIC
Bilirubin Urine: NEGATIVE
Glucose, UA: NEGATIVE mg/dL
Hgb urine dipstick: NEGATIVE
Ketones, ur: NEGATIVE mg/dL
Leukocytes, UA: NEGATIVE
Nitrite: NEGATIVE
Protein, ur: NEGATIVE mg/dL
Specific Gravity, Urine: 1.015 (ref 1.005–1.030)
pH: 7 (ref 5.0–8.0)

## 2017-12-01 LAB — COMPREHENSIVE METABOLIC PANEL WITH GFR
ALT: 16 U/L (ref 14–54)
AST: 23 U/L (ref 15–41)
Albumin: 4.2 g/dL (ref 3.5–5.0)
Alkaline Phosphatase: 69 U/L (ref 38–126)
Anion gap: 7 (ref 5–15)
BUN: 7 mg/dL (ref 6–20)
CO2: 24 mmol/L (ref 22–32)
Calcium: 8.6 mg/dL — ABNORMAL LOW (ref 8.9–10.3)
Chloride: 104 mmol/L (ref 101–111)
Creatinine, Ser: 0.64 mg/dL (ref 0.44–1.00)
GFR calc Af Amer: >60 mL/min
GFR calc non Af Amer: >60 mL/min
Glucose, Bld: 93 mg/dL (ref 65–99)
Potassium: 3.8 mmol/L (ref 3.5–5.1)
Sodium: 135 mmol/L (ref 135–145)
Total Bilirubin: 0.5 mg/dL (ref 0.3–1.2)
Total Protein: 8 g/dL (ref 6.5–8.1)

## 2017-12-01 LAB — PREGNANCY, URINE: Preg Test, Ur: NEGATIVE

## 2017-12-01 LAB — WET PREP, GENITAL
Clue Cells Wet Prep HPF POC: NONE SEEN
Sperm: NONE SEEN
Trich, Wet Prep: NONE SEEN
Yeast Wet Prep HPF POC: NONE SEEN

## 2017-12-01 LAB — LIPASE, BLOOD: Lipase: 18 U/L (ref 11–51)

## 2017-12-01 MED ORDER — HYDROCODONE-ACETAMINOPHEN 5-325 MG PO TABS
1.0000 | ORAL_TABLET | Freq: Once | ORAL | Status: AC
  Administered 2017-12-01: 1 via ORAL
  Filled 2017-12-01: qty 1

## 2017-12-01 MED ORDER — KETOROLAC TROMETHAMINE 30 MG/ML IJ SOLN
30.0000 mg | Freq: Once | INTRAMUSCULAR | Status: AC
  Administered 2017-12-01: 30 mg via INTRAVENOUS
  Filled 2017-12-01: qty 1

## 2017-12-01 MED ORDER — IOPAMIDOL (ISOVUE-300) INJECTION 61%
100.0000 mL | Freq: Once | INTRAVENOUS | Status: AC | PRN
  Administered 2017-12-01: 100 mL via INTRAVENOUS

## 2017-12-01 MED ORDER — ONDANSETRON 4 MG PO TBDP
4.0000 mg | ORAL_TABLET | Freq: Once | ORAL | Status: AC
  Administered 2017-12-01: 4 mg via ORAL
  Filled 2017-12-01: qty 1

## 2017-12-01 MED ORDER — HYDROCODONE-ACETAMINOPHEN 5-325 MG PO TABS: 1.0000 | ORAL_TABLET | Freq: Four times a day (QID) | ORAL | 0 refills | Status: DC | PRN

## 2017-12-01 NOTE — ED Notes (Signed)
ED Provider at bedside. 

## 2017-12-01 NOTE — ED Provider Notes (Signed)
[facility] EMERGENCY DEPARTMENT Provider Note   CSN: [id number] Arrival date & time: 12/01/17  1501     History   Chief Complaint Chief Complaint  Patient presents with  . Pelvic Pain    HPI Alison Scott is a 36 y.o. female.  HPI   Patient 36 year old female presenting with pain in her abdomen. It is bilateral, left worse than right.  She is tearful.  She states "I just want someone to find out what is wrong with me".  She said that her grandma said she might have IBS.  She has had constipation for long periods of time.  She does not know her last stool.  Occasional "feelings of sweat". Nausea, no vomiting. Pain in back, adnexa.  Odorless vaginal discharge.     Past Medical History:  Diagnosis Date  . Hyperthyroidism     Patient Active Problem List   Diagnosis Date Noted  . Hyperthyroidism     Past Surgical History:  Procedure Laterality Date  . CESAREAN SECTION    . HERNIA REPAIR    . THYROIDECTOMY      OB History    No data available       Home Medications    Prior to Admission medications   Medication Sig Start Date End Date Taking? Authorizing Provider  levothyroxine (SYNTHROID, LEVOTHROID) 150 MCG tablet Take 150 mcg by mouth every morning. 01/28/17   [provider]    Family History History reviewed. No pertinent family history.  Social History Social History   Tobacco Use  . Smoking status: Former Smoker  Substance Use Topics  . Alcohol use: Yes    Comment: occasionally  . Drug use: No     Allergies   Percocet [oxycodone-acetaminophen]   Review of Systems Review of Systems  Constitutional: Negative for activity change.  Respiratory: Negative for shortness of breath.   Cardiovascular: Negative for chest pain.  Gastrointestinal: Positive for abdominal pain.  Genitourinary: Positive for pelvic pain and vaginal discharge. Negative for decreased urine volume, dysuria, frequency, hematuria, menstrual  problem and vaginal bleeding.  All other systems reviewed and are negative.    Physical Exam Updated Vital Signs BP 131/87 (BP Location: Left Arm)   Pulse (!) 111   Temp 98.1 F (36.7 C) (Oral)   Resp 18   Ht 5\' 3"  (1.6 m)   Wt 79.4 kg (175 lb)   LMP 11/05/2017   SpO2 100%   BMI 31.00 kg/m   Physical Exam  Constitutional: She is oriented to person, place, and time. She appears well-developed and well-nourished.  HENT:  Head: Normocephalic and atraumatic.  Eyes: Right eye exhibits no discharge. Left eye exhibits no discharge.  Cardiovascular: Normal rate, regular rhythm and normal heart sounds.  No murmur heard. Pulmonary/Chest: Effort normal and breath sounds normal. She has no wheezes. She has no rales.  Abdominal: Soft. She exhibits no distension. There is tenderness.  Bilateral tenderness in abdomen, even to soft touch. When toucher upper abdomen- absolutely no pain in belly.   Genitourinary: Vaginal discharge found.  Genitourinary Comments: No CMT. Non focal tenderness  Neurological: She is oriented to person, place, and time.  Skin: Skin is warm and dry. She is not diaphoretic.  Psychiatric: She has a normal mood and affect.  Nursing note and vitals reviewed.    ED Treatments / Results  Labs (all labs ordered are listed, but only abnormal results are displayed) Labs Reviewed  WET PREP, GENITAL - Abnormal; Notable for the  following components:      Result Value   WBC, Wet Prep HPF POC MANY (*)    All other components within normal limits  URINALYSIS, ROUTINE W REFLEX MICROSCOPIC - Abnormal; Notable for the following components:   Color, Urine STRAW (*)    All other components within normal limits  COMPREHENSIVE METABOLIC PANEL - Abnormal; Notable for the following components:   Calcium 8.6 (*)    All other components within normal limits  CBC WITH DIFFERENTIAL/PLATELET - Abnormal; Notable for the following components:   WBC 11.5 (*)    Neutro Abs 8.1 (*)     All other components within normal limits  PREGNANCY, URINE  LIPASE, BLOOD  GC/CHLAMYDIA PROBE AMP ([place]) NOT AT [facility]    EKG  EKG Interpretation None       Radiology US Transvaginal Non-ob  Result Date: 12/01/2017 CLINICAL DATA:  Pelvic pain for 3 days. EXAM: TRANSABDOMINAL AND TRANSVAGINAL ULTRASOUND OF PELVIS DOPPLER ULTRASOUND OF OVARIES TECHNIQUE: Both transabdominal and transvaginal ultrasound examinations of the pelvis were performed. Transabdominal technique was performed for global imaging of the pelvis including uterus, ovaries, adnexal regions, and pelvic cul-de-sac. It was necessary to proceed with endovaginal exam following the transabdominal exam to visualize the endometrium and ovaries. Color and duplex Doppler ultrasound was utilized to evaluate blood flow to the ovaries. COMPARISON:  None. FINDINGS: Uterus Measurements: 9.7 x 6.6 x 7.1 cm. Left anterior fundal fibroid measures 5.7 x 4.4 x 3.2 cm. Endometrium Thickness: 13 mm.  No focal abnormality visualized. Right ovary Measurements: 3.5 x 1.9 x 2.2 cm. Normal appearance/no adnexal mass. Left ovary Measurements: 2.5 x 1.4 x 2.1 cm. Normal appearance/no adnexal mass. Pulsed Doppler evaluation of both ovaries demonstrates normal low-resistance arterial and venous waveforms. Other findings No abnormal free fluid. IMPRESSION: 1. No acute findings.  No evidence for ovarian mass or torsion. 2. Anterior fundal fibroid. Electronically Signed   By: Kerby Moors M.D.   On: 12/01/2017 16:25   US Pelvis Complete  Result Date: 12/01/2017 CLINICAL DATA:  Pelvic pain for 3 days. EXAM: TRANSABDOMINAL AND TRANSVAGINAL ULTRASOUND OF PELVIS DOPPLER ULTRASOUND OF OVARIES TECHNIQUE: Both transabdominal and transvaginal ultrasound examinations of the pelvis were performed. Transabdominal technique was performed for global imaging of the pelvis including uterus, ovaries, adnexal regions, and pelvic cul-de-sac. It was necessary to proceed  with endovaginal exam following the transabdominal exam to visualize the endometrium and ovaries. Color and duplex Doppler ultrasound was utilized to evaluate blood flow to the ovaries. COMPARISON:  None. FINDINGS: Uterus Measurements: 9.7 x 6.6 x 7.1 cm. Left anterior fundal fibroid measures 5.7 x 4.4 x 3.2 cm. Endometrium Thickness: 13 mm.  No focal abnormality visualized. Right ovary Measurements: 3.5 x 1.9 x 2.2 cm. Normal appearance/no adnexal mass. Left ovary Measurements: 2.5 x 1.4 x 2.1 cm. Normal appearance/no adnexal mass. Pulsed Doppler evaluation of both ovaries demonstrates normal low-resistance arterial and venous waveforms. Other findings No abnormal free fluid. IMPRESSION: 1. No acute findings.  No evidence for ovarian mass or torsion. 2. Anterior fundal fibroid. Electronically Signed   By: Kerby Moors M.D.   On: 12/01/2017 16:25   Ct Abdomen Pelvis W Contrast  Result Date: 12/01/2017 CLINICAL DATA:  Left lower quadrant abdominal pain EXAM: CT ABDOMEN AND PELVIS WITH CONTRAST TECHNIQUE: Multidetector CT imaging of the abdomen and pelvis was performed using the standard protocol following bolus administration of intravenous contrast. CONTRAST:  188mL ISOVUE-300 IOPAMIDOL (ISOVUE-300) INJECTION 61% COMPARISON:  None FINDINGS: Lower chest: No  acute abnormality. Hepatobiliary: No focal liver abnormality is seen. No gallstones, gallbladder wall thickening, or biliary dilatation. Pancreas: Unremarkable. No pancreatic ductal dilatation or surrounding inflammatory changes. Spleen: Normal in size without focal abnormality. Adrenals/Urinary Tract: Normal appearance of the right adrenal gland. 1.3 cm low-attenuation nodule in the left adrenal gland is identified, image number 30 of series 2. Normal appearance of the kidneys. No mass or hydronephrosis. Urinary bladder appears normal Stomach/Bowel: The stomach is normal. The appendix is visualized and appears normal. No pathologic dilatation of the colon.  Vascular/Lymphatic: Normal appearance of the abdominal aorta. No enlarged retroperitoneal or mesenteric adenopathy. No enlarged pelvic or inguinal lymph nodes. Reproductive: The uterus appears enlarged. Hypodense mass arising from the left anterior fundus measures 5.8 cm, image 70 of series 6. No adnexal mass identified. Hypodense tubular structure posterior to the uterus identified which may reflect un opacified loops of small bowel or hydrosalpinx. Other: No free fluid identified within the pelvis. Musculoskeletal: No acute or significant osseous findings. IMPRESSION: 1. There is a large hypodense mass within the anterior uterine fundus. This is favored to represent a degenerating uterine fibroid, which may account for the patient's lower abdominal pain. Further evaluation with pelvic sonogram may provide additional diagnostic information. 2. Low-attenuation tubular structures within the posterior pelvis may reflect on opacified loops of small bowel versus hydrosalpinx. This could also be better assessed with pelvic sonogram. 3. Indeterminate left adrenal nodule. Electronically Signed   By: Kerby Moors M.D.   On: 12/01/2017 17:33   [place] Art/ven Flow Abd Pelv Doppler  Result Date: 12/01/2017 CLINICAL DATA:  Pelvic pain for 3 days. EXAM: TRANSABDOMINAL AND TRANSVAGINAL ULTRASOUND OF PELVIS DOPPLER ULTRASOUND OF OVARIES TECHNIQUE: Both transabdominal and transvaginal ultrasound examinations of the pelvis were performed. Transabdominal technique was performed for global imaging of the pelvis including uterus, ovaries, adnexal regions, and pelvic cul-de-sac. It was necessary to proceed with endovaginal exam following the transabdominal exam to visualize the endometrium and ovaries. Color and duplex Doppler ultrasound was utilized to evaluate blood flow to the ovaries. COMPARISON:  None. FINDINGS: Uterus Measurements: 9.7 x 6.6 x 7.1 cm. Left anterior fundal fibroid measures 5.7 x 4.4 x 3.2 cm. Endometrium  Thickness: 13 mm.  No focal abnormality visualized. Right ovary Measurements: 3.5 x 1.9 x 2.2 cm. Normal appearance/no adnexal mass. Left ovary Measurements: 2.5 x 1.4 x 2.1 cm. Normal appearance/no adnexal mass. Pulsed Doppler evaluation of both ovaries demonstrates normal low-resistance arterial and venous waveforms. Other findings No abnormal free fluid. IMPRESSION: 1. No acute findings.  No evidence for ovarian mass or torsion. 2. Anterior fundal fibroid. Electronically Signed   By: Kerby Moors M.D.   On: 12/01/2017 16:25    Procedures Procedures (including critical care time)  Medications Ordered in ED Medications  HYDROcodone-acetaminophen (NORCO/VICODIN) 5-325 MG per tablet 1 tablet (not administered)  ondansetron (ZOFRAN-ODT) disintegrating tablet 4 mg (4 mg Oral Given 12/01/17 1611)  ketorolac (TORADOL) 30 MG/ML injection 30 mg (30 mg Intravenous Given 12/01/17 1641)  iopamidol (ISOVUE-300) 61 % injection 100 mL (100 mLs Intravenous Contrast Given 12/01/17 1708)     Initial Impression / Assessment and Plan / ED Course  I have reviewed the triage vital signs and the nursing notes.  Pertinent labs & imaging results that were available during my care of the patient were reviewed by me and considered in my medical decision making (see chart for details).     Patient 36 year old female presenting with pain in her abdomen. It is bilateral, left  worse than right.  She is tearful.  She states "I just want someone to find out what is wrong with me".  She said that her grandma said she might have IBS.  She has had constipation for long periods of time.  She does not know her last stool.  Occasional "feelings of sweat". Nausea, no vomiting. Pain in back, adnexa.  Odorless vaginal discharge.   4:53 PM Odd constellation of symptoms.  Not typical for kidney stone, ovarian, or abdominal process.  Will rule out one at a time.  Will get urine, transvaginal ultrasound, do pelvic exam.    These  are all negative.  Now move on to labs, CT.  5:52 PM Patient has normal vital signs normal labs.  The transitional ultrasound as well as the CT show evidence of fibroid.  I think is likely the cause of her pain.  We will give her some pain medication, and follow-up with her OB/GYN on Monday.    Final Clinical Impressions(s) / ED Diagnoses   Final diagnoses:  None    ED Discharge Orders    None       Macarthur Critchley, MD 12/01/17 1752

## 2017-12-01 NOTE — ED Triage Notes (Signed)
Pelvic pain. Worse on the left x several days. Odorless vaginal discharge.

## 2017-12-01 NOTE — Discharge Instructions (Signed)
We think the pain is likely due to fibroids.  Please return with any concerning symptoms.

## 2017-12-01 NOTE — ED Notes (Signed)
THIS NT WENT TO OBTAIN VITAL SIGNS. PT IS IN ULTRASOUND.

## 2017-12-03 LAB — GC/CHLAMYDIA PROBE AMP (~~LOC~~) NOT AT ARMC
Chlamydia: NEGATIVE
Neisseria Gonorrhea: NEGATIVE

## 2018-08-03 IMAGING — CT CT ABD-PELV W/ CM
2 of 4 series · 16 of 46 positions shown, 18 images · IV contrast (iopamidol)
Comparison: None

CLINICAL DATA: Left lower quadrant abdominal pain

EXAM:
CT ABDOMEN AND PELVIS WITH CONTRAST
TECHNIQUE: Multidetector CT imaging of the abdomen and pelvis was performed
using the standard protocol following bolus administration of
intravenous contrast.
CONTRAST:  100mL [id number] IOPAMIDOL ([id number]) INJECTION 61%

[Series 2: axial st · axial · 0.74mm/px · z∈[+492,+932]mm · 13 of 98 slices shown, 15 images]
[im 5/98  soft-tissue]
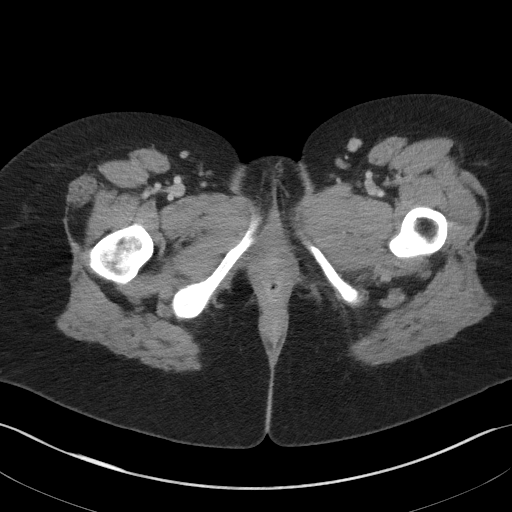
[im 5/98  bone]
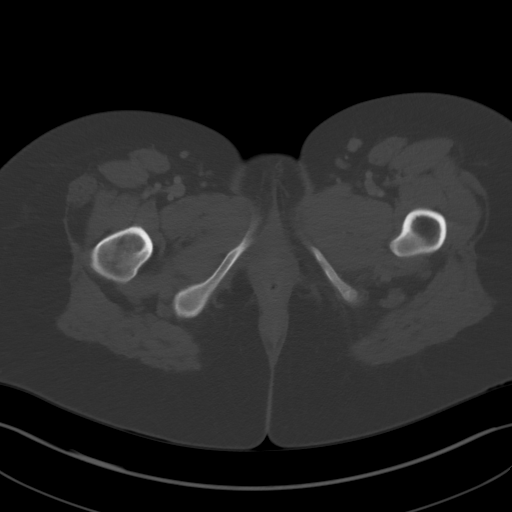
[im 14/98  soft-tissue]
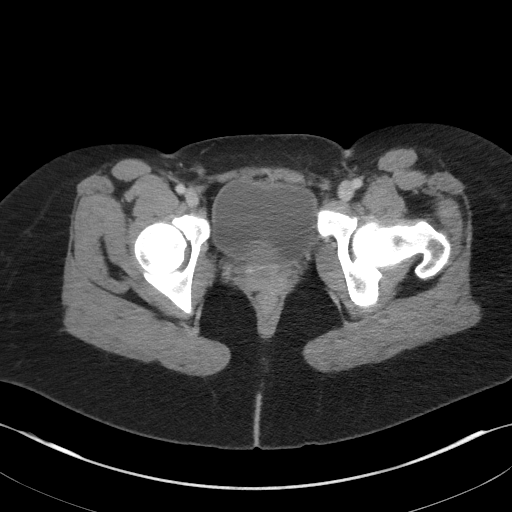
[im 23/98  soft-tissue]
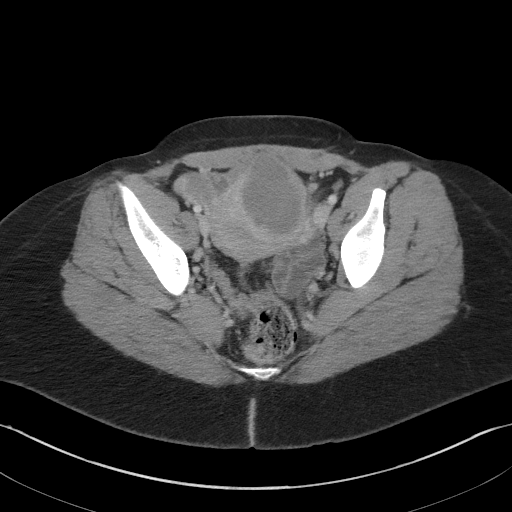
[im 27/98  soft-tissue]
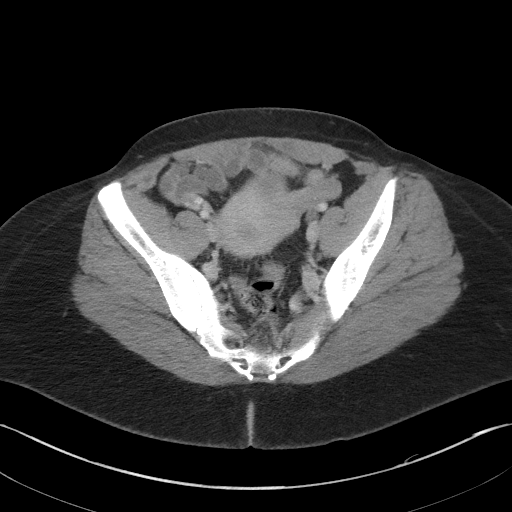
[im 36/98  soft-tissue]
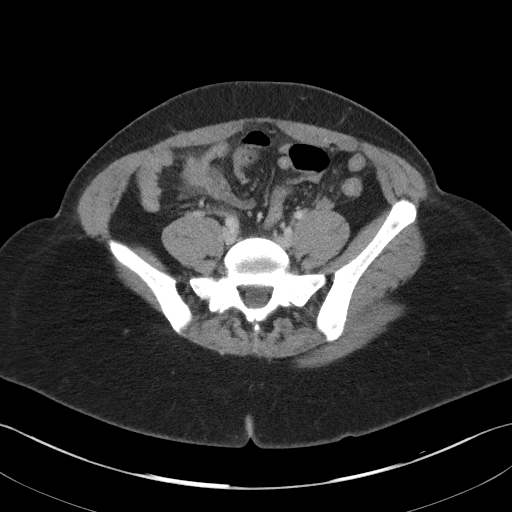
[im 40/98  soft-tissue]
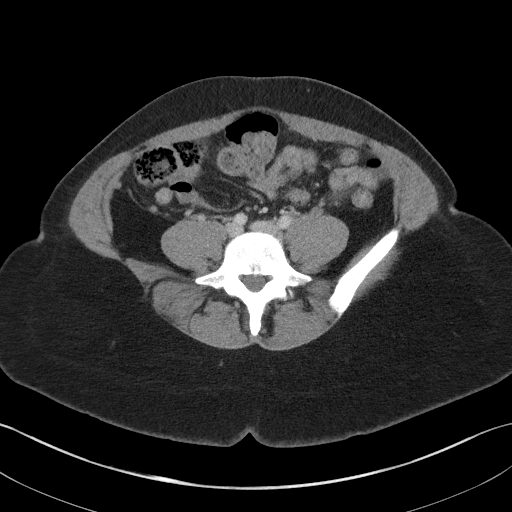
[im 49/98  soft-tissue]
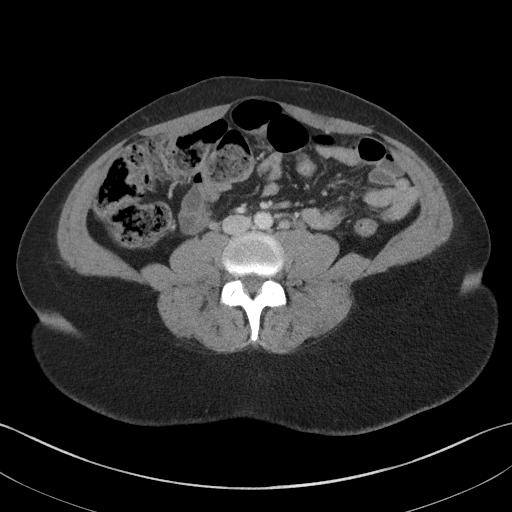
[im 58/98  soft-tissue]
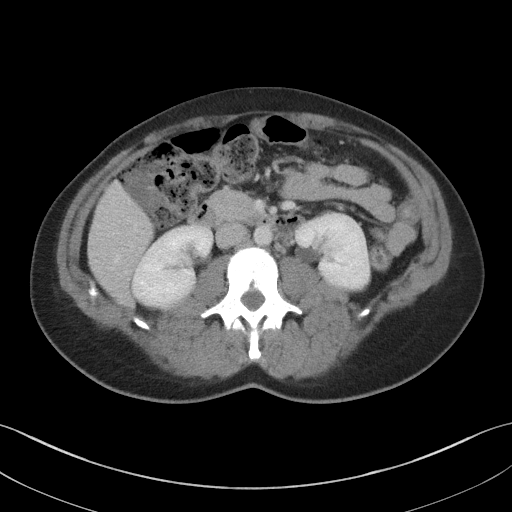
[im 62/98  soft-tissue]
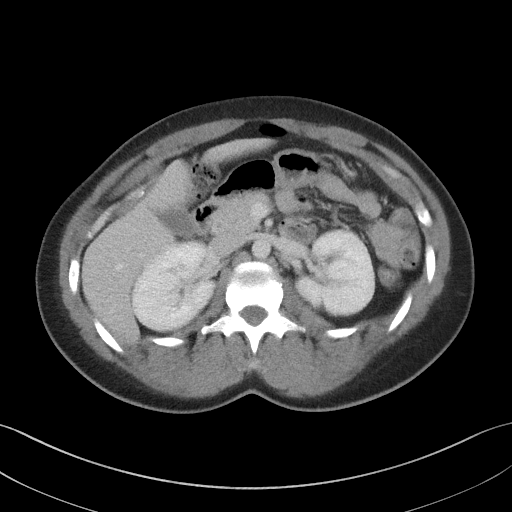
[im 62/98  bone]
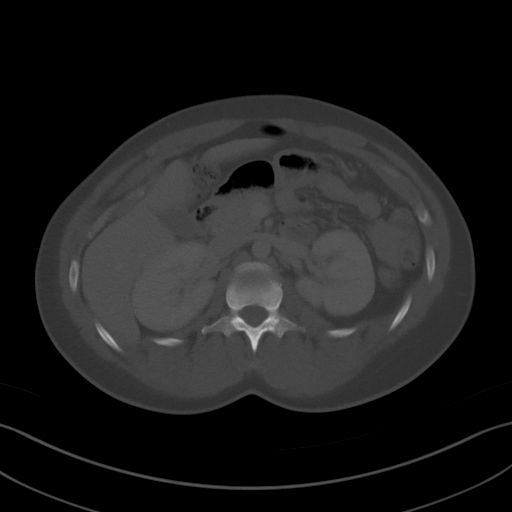
[im 71/98  soft-tissue]
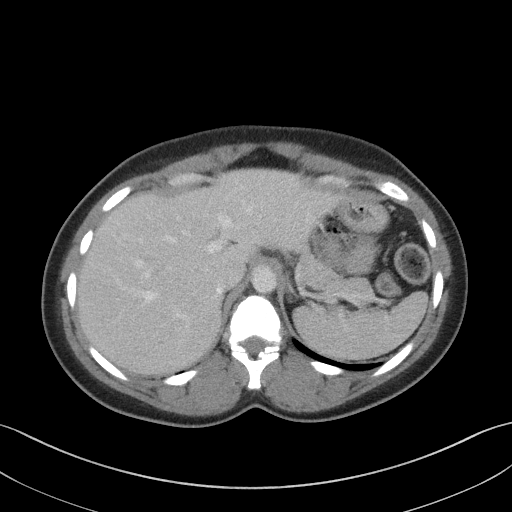
[im 75/98  soft-tissue]
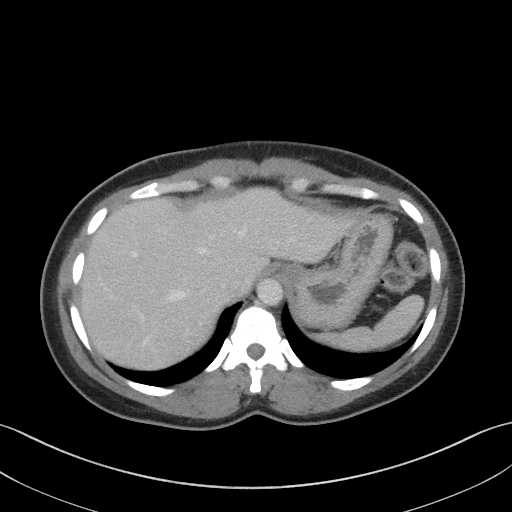
[im 84/98  soft-tissue]
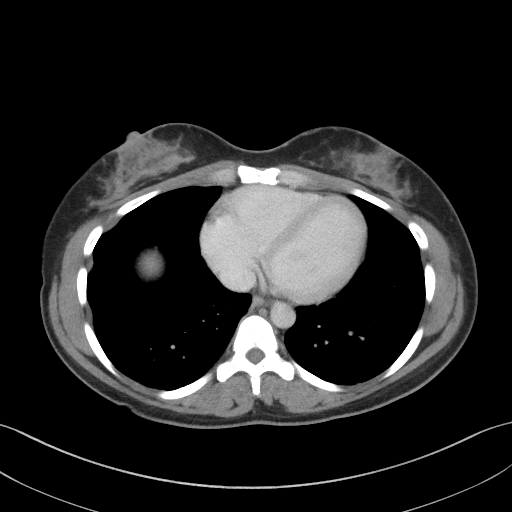
[im 93/98  soft-tissue]
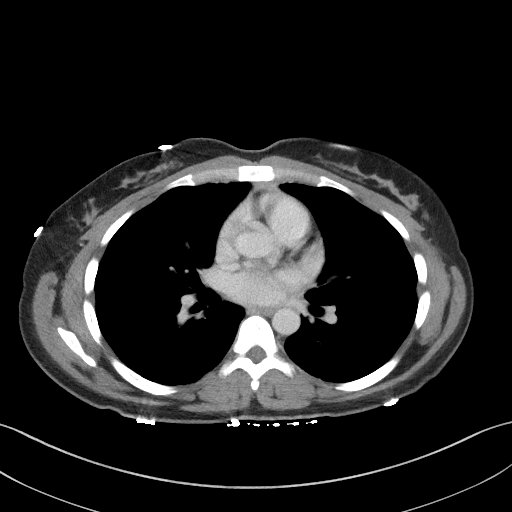

[Series 5: coronal st · coronal · 0.87mm/px · 3 of 90 slices shown]
[im 30/90  soft-tissue]
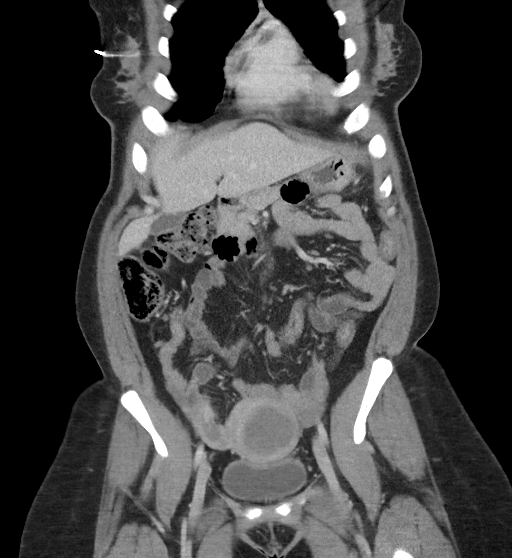
[im 40/90  soft-tissue]
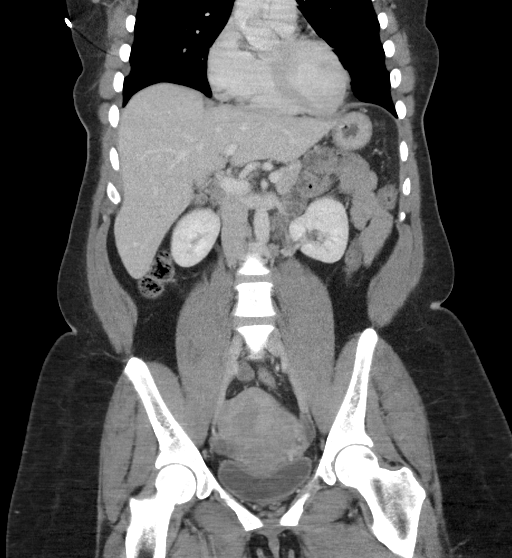
[im 50/90  soft-tissue]
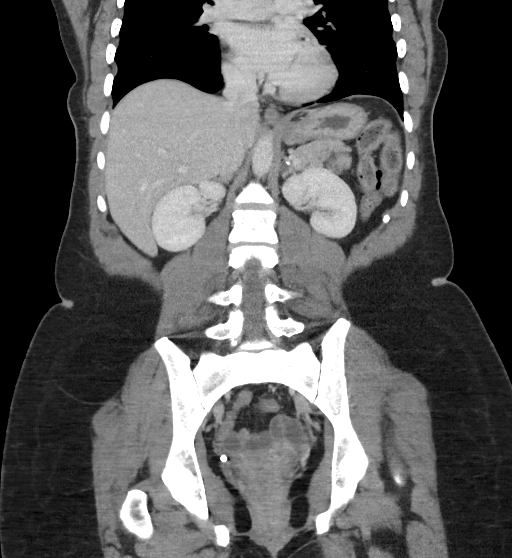

[16 of 46 positions shown; findings below may reference images not displayed]

FINDINGS: Lower chest: No acute abnormality.

Hepatobiliary: No focal liver abnormality is seen. No gallstones,
gallbladder wall thickening, or biliary dilatation.

Pancreas: Unremarkable. No pancreatic ductal dilatation or
surrounding inflammatory changes.

Spleen: Normal in size without focal abnormality.

Adrenals/Urinary Tract: Normal appearance of the right adrenal
gland. 1.3 cm low-attenuation nodule in the left adrenal gland is
identified, image number 30 of series 2. Normal appearance of the
kidneys. No mass or hydronephrosis. Urinary bladder appears normal

Stomach/Bowel: The stomach is normal. The appendix is visualized and
appears normal. No pathologic dilatation of the colon.

Vascular/Lymphatic: Normal appearance of the abdominal aorta. No
enlarged retroperitoneal or mesenteric adenopathy. No enlarged
pelvic or inguinal lymph nodes.

Reproductive: The uterus appears enlarged. Hypodense mass arising
from the left anterior fundus measures 5.8 cm, image 70 of series 6.
No adnexal mass identified. Hypodense tubular structure posterior to
the uterus identified which may reflect un opacified loops of small
bowel or hydrosalpinx.

Other: No free fluid identified within the pelvis.

Musculoskeletal: No acute or significant osseous findings.
IMPRESSION: 1. There is a large hypodense mass within the anterior uterine
fundus. This is favored to represent a degenerating uterine fibroid,
which may account for the patient's lower abdominal pain. Further
evaluation with pelvic sonogram may provide additional diagnostic
information.
2. Low-attenuation tubular structures within the posterior pelvis
may reflect on opacified loops of small bowel versus hydrosalpinx.
This could also be better assessed with pelvic sonogram.
3. Indeterminate left adrenal nodule.

## 2018-11-18 ENCOUNTER — Other Ambulatory Visit: Payer: Self-pay

## 2018-11-18 ENCOUNTER — Encounter (HOSPITAL_COMMUNITY): Payer: Self-pay | Admitting: Emergency Medicine

## 2018-11-18 ENCOUNTER — Emergency Department (HOSPITAL_COMMUNITY)
Admission: EM | Admit: 2018-11-18 | Discharge: 2018-11-18 | Discharge status: Against Medical Advice | Payer: Managed Care, Other (non HMO) | Attending: Emergency Medicine | Admitting: Emergency Medicine

## 2018-11-18 DIAGNOSIS — R42 Dizziness and giddiness: Secondary | ICD-10-CM | POA: Diagnosis present

## 2018-11-18 DIAGNOSIS — Z5321 Procedure and treatment not carried out due to patient leaving prior to being seen by health care provider: Secondary | ICD-10-CM | POA: Diagnosis not present

## 2018-11-18 NOTE — ED Triage Notes (Signed)
Pt. Stated, I was at [facility], and told to come here cause I had a heart blockage. Ive had nausea, headache like someone is sitting on top of my head . Ive been light-headed and SOB , this started a month ago.

## 2018-11-18 NOTE — ED Notes (Signed)
Pt left could not wait

## 2018-11-18 NOTE — ED Triage Notes (Signed)
Pt. Stated, I feel like someone pushing my head.

## 2019-01-23 DIAGNOSIS — J3089 Other allergic rhinitis: Secondary | ICD-10-CM | POA: Diagnosis not present

## 2019-01-23 DIAGNOSIS — H6692 Otitis media, unspecified, left ear: Secondary | ICD-10-CM | POA: Diagnosis not present

## 2019-01-23 DIAGNOSIS — J069 Acute upper respiratory infection, unspecified: Secondary | ICD-10-CM | POA: Diagnosis not present

## 2019-03-24 DIAGNOSIS — Z0000 Encounter for general adult medical examination without abnormal findings: Secondary | ICD-10-CM | POA: Diagnosis not present

## 2019-05-05 DIAGNOSIS — E890 Postprocedural hypothyroidism: Secondary | ICD-10-CM | POA: Diagnosis not present

## 2019-07-29 DIAGNOSIS — R10814 Left lower quadrant abdominal tenderness: Secondary | ICD-10-CM | POA: Diagnosis not present

## 2019-07-29 DIAGNOSIS — R3121 Asymptomatic microscopic hematuria: Secondary | ICD-10-CM | POA: Diagnosis not present

## 2019-08-04 DIAGNOSIS — N83202 Unspecified ovarian cyst, left side: Secondary | ICD-10-CM | POA: Diagnosis not present

## 2019-08-08 DIAGNOSIS — N83202 Unspecified ovarian cyst, left side: Secondary | ICD-10-CM | POA: Diagnosis not present

## 2019-08-08 DIAGNOSIS — Z3202 Encounter for pregnancy test, result negative: Secondary | ICD-10-CM | POA: Diagnosis not present

## 2019-08-08 DIAGNOSIS — N912 Amenorrhea, unspecified: Secondary | ICD-10-CM | POA: Diagnosis not present

## 2019-10-15 DIAGNOSIS — D251 Intramural leiomyoma of uterus: Secondary | ICD-10-CM | POA: Diagnosis not present

## 2019-10-15 DIAGNOSIS — D250 Submucous leiomyoma of uterus: Secondary | ICD-10-CM | POA: Diagnosis not present

## 2019-10-15 DIAGNOSIS — N920 Excessive and frequent menstruation with regular cycle: Secondary | ICD-10-CM | POA: Diagnosis not present

## 2019-10-28 DIAGNOSIS — Z1151 Encounter for screening for human papillomavirus (HPV): Secondary | ICD-10-CM | POA: Diagnosis not present

## 2019-10-28 DIAGNOSIS — Z6833 Body mass index (BMI) 33.0-33.9, adult: Secondary | ICD-10-CM | POA: Diagnosis not present

## 2019-10-28 DIAGNOSIS — N920 Excessive and frequent menstruation with regular cycle: Secondary | ICD-10-CM | POA: Diagnosis not present

## 2019-10-28 DIAGNOSIS — Z01419 Encounter for gynecological examination (general) (routine) without abnormal findings: Secondary | ICD-10-CM | POA: Diagnosis not present

## 2019-11-03 ENCOUNTER — Other Ambulatory Visit: Payer: Self-pay | Admitting: Obstetrics and Gynecology

## 2019-11-03 DIAGNOSIS — D250 Submucous leiomyoma of uterus: Secondary | ICD-10-CM

## 2019-11-05 ENCOUNTER — Ambulatory Visit
Admission: RE | Admit: 2019-11-05 | Discharge: 2019-11-05 | Disposition: A | Discharge status: Home / Self Care | Payer: Managed Care, Other (non HMO) | Source: Ambulatory Visit | Attending: Obstetrics and Gynecology | Admitting: Obstetrics and Gynecology

## 2019-11-05 ENCOUNTER — Other Ambulatory Visit: Payer: Self-pay

## 2019-11-05 ENCOUNTER — Other Ambulatory Visit: Payer: Self-pay | Admitting: Obstetrics and Gynecology

## 2019-11-05 ENCOUNTER — Other Ambulatory Visit: Payer: Self-pay | Admitting: Interventional Radiology

## 2019-11-05 DIAGNOSIS — D259 Leiomyoma of uterus, unspecified: Secondary | ICD-10-CM | POA: Diagnosis not present

## 2019-11-05 DIAGNOSIS — D250 Submucous leiomyoma of uterus: Secondary | ICD-10-CM

## 2019-11-05 HISTORY — PX: IR RADIOLOGIST EVAL & MGMT: IMG5224

## 2019-11-05 NOTE — Consult Note (Signed)
Chief Complaint: Patient was consulted remotely today (TeleHealth) for uterine fibroids at the request of Alison Scott.    Referring Physician(s): Alison Scott  History of Present Illness: Alison Scott is a 38 y.o. female who presents at the kind request of Dr. Garwin Brothers to discuss minimally invasive options for symptomatic uterine fibroids.  Alison Scott has struggled with fibroids for some time but, over the last several months her symptoms have become increasingly profound.  She has near constant pain during her menstrual cycles.  Additionally, the bleeding is extremely heavy, particularly the first 3 days when she has to change her tampons every 1 to 1.5 hours.  She also experiences passage of significant clots and feels weak and dizzy.  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  It is very difficult to maintain her active work lifestyle during her cycles.    She denies dyspareunia, urinary or bowel symptoms.  She denies fever, chills or other systemic issues.  Past Medical History:  Diagnosis Date  . Hyperthyroidism     Past Surgical History:  Procedure Laterality Date  . CESAREAN SECTION    . HERNIA REPAIR    . THYROIDECTOMY      Allergies: Percocet [oxycodone-acetaminophen]  Medications: Prior to Admission medications   Medication Sig Start Date End Date Taking? Authorizing Provider  HYDROcodone-acetaminophen (NORCO/VICODIN) 5-325 MG tablet Take 1 tablet by mouth every 6 (six) hours as needed. 12/01/17   Mackuen, Courteney Lyn, MD  levothyroxine (SYNTHROID, LEVOTHROID) 150 MCG tablet Take 150 mcg by mouth every morning. 01/28/17   [provider]     No family history on file.  Social History   Socioeconomic History  . Marital status: Married    Spouse name: Not on file  . Number of children: Not on file  . Years of education: Not on file  . Highest education level: Not on file  Occupational History  . Not on file   Social Needs  . Financial resource strain: Not on file  . Food insecurity    Worry: Not on file    Inability: Not on file  . Transportation needs    Medical: Not on file    Non-medical: Not on file  Tobacco Use  . Smoking status: Former Research scientist (life sciences)  . Smokeless tobacco: Former Network engineer and Sexual Activity  . Alcohol use: Yes    Comment: occasionally  . Drug use: No  . Sexual activity: Yes    Birth control/protection: None  Lifestyle  . Physical activity    Days per week: Not on file    Minutes per session: Not on file  . Stress: Not on file  Relationships  . Social Herbalist on phone: Not on file    Gets together: Not on file    Attends religious service: Not on file    Active member of club or organization: Not on file    Attends meetings of clubs or organizations: Not on file    Relationship status: Not on file  Other Topics Concern  . Not on file  Social History Narrative   ** Merged History Encounter **        Review of Systems  Review of Systems: A 12 point ROS discussed and pertinent positives are indicated in the HPI above.  All other systems are negative.  Physical Exam No direct physical exam was performed (except for noted visual exam findings with Video Visits).   Vital Signs: There  were no vitals taken for this visit.  Imaging: No results found.  Labs:  CBC: No results for input(s): WBC, HGB, HCT, PLT in the last 8760 hours.  COAGS: No results for input(s): INR, APTT in the last 8760 hours.  BMP: No results for input(s): NA, K, CL, CO2, GLUCOSE, BUN, CALCIUM, CREATININE, GFRNONAA, GFRAA in the last 8760 hours.  Invalid input(s): CMP  LIVER FUNCTION TESTS: No results for input(s): BILITOT, AST, ALT, ALKPHOS, PROT, ALBUMIN in the last 8760 hours.  TUMOR MARKERS: No results for input(s): AFPTM, CEA, CA199, CHROMGRNA in the last 8760 hours.  Assessment and Plan:  38 year old female with symptomatic uterine fibroids.  Her  dominant symptoms include both menorrhagia and dysmenorrhea.  We discussed the natural history of uterine fibroids as well as the various treatment modalities.  She is not interested in surgical intervention at this time but would consider a more minimally invasive option such as uterine artery embolization.  We discussed the risk, benefits, alternatives and potential outcomes to uterine artery embolization.  She understands that we would also do a superior hypogastric nerve block at the beginning of the procedure to help manage her postprocedural discomfort.  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  1.)  Please schedule for pelvic MRI with gadolinium contrast as soon as possible. 2.)  Barring any unexpected findings on the MRI, we can then proceed with superior hypogastric nerve block and bilateral uterine artery embolization.  She would like to have this procedure done close to the holidays, perhaps mid or late December.  Thank you for this interesting consult.  I greatly enjoyed meeting Alison Scott and look forward to participating in their care.  A copy of this report was sent to the requesting provider on this date.  Electronically Signed: Jacqulynn Cadet 11/05/2019, 2:47 PM   I spent a total of  30 Minutes  in remote  clinical consultation, greater than 50% of which was counseling/coordinating care for .    Visit type: Audio only (telephone). Audio (no video) only due to patient preference. Alternative for in-person consultation at [facility], [address], [place], [place]. This visit type was conducted due to national recommendations for restrictions regarding the COVID-19 Pandemic (e.g. social distancing).  This format is felt to be most appropriate for this patient at this time.  All issues noted in this document were discussed and  addressed.

## 2019-11-06 ENCOUNTER — Other Ambulatory Visit: Payer: Self-pay | Admitting: Interventional Radiology

## 2019-11-06 DIAGNOSIS — D250 Submucous leiomyoma of uterus: Secondary | ICD-10-CM

## 2019-11-12 ENCOUNTER — Ambulatory Visit (HOSPITAL_COMMUNITY)
Admission: RE | Admit: 2019-11-12 | Discharge: 2019-11-12 | Disposition: A | Discharge status: Home / Self Care | Payer: BC Managed Care – PPO | Source: Ambulatory Visit | Attending: Obstetrics and Gynecology | Admitting: Obstetrics and Gynecology

## 2019-11-12 ENCOUNTER — Other Ambulatory Visit: Payer: Self-pay

## 2019-11-12 DIAGNOSIS — D259 Leiomyoma of uterus, unspecified: Secondary | ICD-10-CM | POA: Diagnosis not present

## 2019-11-12 DIAGNOSIS — D250 Submucous leiomyoma of uterus: Secondary | ICD-10-CM | POA: Diagnosis not present

## 2019-11-12 LAB — POCT I-STAT CREATININE: Creatinine, Ser: 0.7 mg/dL (ref 0.44–1.00)

## 2019-11-12 MED ORDER — GADOBUTROL 1 MMOL/ML IV SOLN
8.0000 mL | Freq: Once | INTRAVENOUS | Status: AC | PRN
  Administered 2019-11-12: 8 mL via INTRAVENOUS

## 2019-12-28 DIAGNOSIS — Z20828 Contact with and (suspected) exposure to other viral communicable diseases: Secondary | ICD-10-CM | POA: Diagnosis not present

## 2020-01-05 DIAGNOSIS — Z1152 Encounter for screening for COVID-19: Secondary | ICD-10-CM | POA: Diagnosis not present

## 2020-01-05 DIAGNOSIS — Z03818 Encounter for observation for suspected exposure to other biological agents ruled out: Secondary | ICD-10-CM | POA: Diagnosis not present

## 2020-03-29 DIAGNOSIS — L739 Follicular disorder, unspecified: Secondary | ICD-10-CM | POA: Diagnosis not present

## 2020-09-13 DIAGNOSIS — R3121 Asymptomatic microscopic hematuria: Secondary | ICD-10-CM | POA: Diagnosis not present

## 2020-09-20 DIAGNOSIS — R1909 Other intra-abdominal and pelvic swelling, mass and lump: Secondary | ICD-10-CM | POA: Diagnosis not present

## 2020-09-20 DIAGNOSIS — R1900 Intra-abdominal and pelvic swelling, mass and lump, unspecified site: Secondary | ICD-10-CM | POA: Diagnosis not present

## 2020-10-18 DIAGNOSIS — E278 Other specified disorders of adrenal gland: Secondary | ICD-10-CM | POA: Diagnosis not present

## 2020-10-18 DIAGNOSIS — D259 Leiomyoma of uterus, unspecified: Secondary | ICD-10-CM | POA: Diagnosis not present

## 2020-10-25 DIAGNOSIS — Z124 Encounter for screening for malignant neoplasm of cervix: Secondary | ICD-10-CM | POA: Diagnosis not present

## 2020-10-25 DIAGNOSIS — D251 Intramural leiomyoma of uterus: Secondary | ICD-10-CM | POA: Diagnosis not present

## 2020-10-25 DIAGNOSIS — N920 Excessive and frequent menstruation with regular cycle: Secondary | ICD-10-CM | POA: Diagnosis not present

## 2020-10-25 DIAGNOSIS — R5383 Other fatigue: Secondary | ICD-10-CM | POA: Diagnosis not present

## 2020-10-25 DIAGNOSIS — E039 Hypothyroidism, unspecified: Secondary | ICD-10-CM | POA: Diagnosis not present

## 2020-11-25 DIAGNOSIS — K5904 Chronic idiopathic constipation: Secondary | ICD-10-CM | POA: Diagnosis not present

## 2020-11-25 DIAGNOSIS — K625 Hemorrhage of anus and rectum: Secondary | ICD-10-CM | POA: Diagnosis not present

## 2020-11-25 DIAGNOSIS — Z1211 Encounter for screening for malignant neoplasm of colon: Secondary | ICD-10-CM | POA: Diagnosis not present

## 2020-12-06 DIAGNOSIS — K625 Hemorrhage of anus and rectum: Secondary | ICD-10-CM | POA: Diagnosis not present

## 2021-03-29 ENCOUNTER — Emergency Department (HOSPITAL_BASED_OUTPATIENT_CLINIC_OR_DEPARTMENT_OTHER): Payer: BC Managed Care – PPO

## 2021-03-29 ENCOUNTER — Other Ambulatory Visit (HOSPITAL_BASED_OUTPATIENT_CLINIC_OR_DEPARTMENT_OTHER): Payer: Self-pay

## 2021-03-29 ENCOUNTER — Other Ambulatory Visit: Payer: Self-pay

## 2021-03-29 ENCOUNTER — Emergency Department (HOSPITAL_BASED_OUTPATIENT_CLINIC_OR_DEPARTMENT_OTHER)
Admission: EM | Admit: 2021-03-29 | Discharge: 2021-03-29 | Disposition: A | Discharge status: Home / Self Care | Payer: BC Managed Care – PPO | Source: Home / Self Care | Attending: Emergency Medicine | Admitting: Emergency Medicine

## 2021-03-29 ENCOUNTER — Encounter (HOSPITAL_BASED_OUTPATIENT_CLINIC_OR_DEPARTMENT_OTHER): Payer: Self-pay | Admitting: Emergency Medicine

## 2021-03-29 DIAGNOSIS — Z87891 Personal history of nicotine dependence: Secondary | ICD-10-CM | POA: Insufficient documentation

## 2021-03-29 DIAGNOSIS — R102 Pelvic and perineal pain: Secondary | ICD-10-CM | POA: Diagnosis not present

## 2021-03-29 DIAGNOSIS — R112 Nausea with vomiting, unspecified: Secondary | ICD-10-CM | POA: Insufficient documentation

## 2021-03-29 DIAGNOSIS — D251 Intramural leiomyoma of uterus: Secondary | ICD-10-CM | POA: Diagnosis not present

## 2021-03-29 DIAGNOSIS — D259 Leiomyoma of uterus, unspecified: Secondary | ICD-10-CM | POA: Insufficient documentation

## 2021-03-29 LAB — CBC WITH DIFFERENTIAL/PLATELET
Abs Immature Granulocytes: 0.08 10*3/uL — ABNORMAL HIGH (ref 0.00–0.07)
Basophils Absolute: 0.1 10*3/uL (ref 0.0–0.1)
Basophils Relative: 0 %
Eosinophils Absolute: 0 10*3/uL (ref 0.0–0.5)
Eosinophils Relative: 0 %
HCT: 35.6 % — ABNORMAL LOW (ref 36.0–46.0)
Hemoglobin: 11.5 g/dL — ABNORMAL LOW (ref 12.0–15.0)
Immature Granulocytes: 0 %
Lymphocytes Relative: 7 %
Lymphs Abs: 1.3 10*3/uL (ref 0.7–4.0)
MCH: 27 pg (ref 26.0–34.0)
MCHC: 32.3 g/dL (ref 30.0–36.0)
MCV: 83.6 fL (ref 80.0–100.0)
Monocytes Absolute: 2 10*3/uL — ABNORMAL HIGH (ref 0.1–1.0)
Monocytes Relative: 11 %
Neutro Abs: 14.4 10*3/uL — ABNORMAL HIGH (ref 1.7–7.7)
Neutrophils Relative %: 82 %
Platelets: 307 10*3/uL (ref 150–400)
RBC: 4.26 MIL/uL (ref 3.87–5.11)
RDW: 16.5 % — ABNORMAL HIGH (ref 11.5–15.5)
WBC: 17.8 10*3/uL — ABNORMAL HIGH (ref 4.0–10.5)
nRBC: 0 % (ref 0.0–0.2)

## 2021-03-29 LAB — COMPREHENSIVE METABOLIC PANEL
ALT: 17 U/L (ref 0–44)
AST: 39 U/L (ref 15–41)
Albumin: 3.9 g/dL (ref 3.5–5.0)
Alkaline Phosphatase: 49 U/L (ref 38–126)
Anion gap: 9 (ref 5–15)
BUN: 5 mg/dL — ABNORMAL LOW (ref 6–20)
CO2: 20 mmol/L — ABNORMAL LOW (ref 22–32)
Calcium: 8.7 mg/dL — ABNORMAL LOW (ref 8.9–10.3)
Chloride: 105 mmol/L (ref 98–111)
Creatinine, Ser: 0.61 mg/dL (ref 0.44–1.00)
GFR, Estimated: >60 mL/min (ref >60)
Glucose, Bld: 126 mg/dL — ABNORMAL HIGH (ref 70–99)
Potassium: 3.5 mmol/L (ref 3.5–5.1)
Sodium: 134 mmol/L — ABNORMAL LOW (ref 135–145)
Total Bilirubin: 0.6 mg/dL (ref 0.3–1.2)
Total Protein: 7.7 g/dL (ref 6.5–8.1)

## 2021-03-29 LAB — PREGNANCY, URINE: Preg Test, Ur: NEGATIVE

## 2021-03-29 LAB — LIPASE, BLOOD: Lipase: 22 U/L (ref 11–51)

## 2021-03-29 LAB — URINALYSIS, MICROSCOPIC (REFLEX)

## 2021-03-29 LAB — URINALYSIS, ROUTINE W REFLEX MICROSCOPIC
Bilirubin Urine: NEGATIVE
Glucose, UA: NEGATIVE mg/dL
Ketones, ur: 40 mg/dL — AB
Leukocytes,Ua: NEGATIVE
Nitrite: NEGATIVE
Protein, ur: NEGATIVE mg/dL
Specific Gravity, Urine: >1.03 — ABNORMAL HIGH (ref 1.005–1.030)
pH: 6 (ref 5.0–8.0)

## 2021-03-29 LAB — WET PREP, GENITAL
Clue Cells Wet Prep HPF POC: NONE SEEN
Sperm: NONE SEEN
Trich, Wet Prep: NONE SEEN

## 2021-03-29 MED ORDER — MORPHINE SULFATE (PF) 4 MG/ML IV SOLN
4.0000 mg | Freq: Once | INTRAVENOUS | Status: AC
  Administered 2021-03-29: 4 mg via INTRAVENOUS
  Filled 2021-03-29: qty 1

## 2021-03-29 MED ORDER — HYDROMORPHONE HCL 1 MG/ML IJ SOLN
1.0000 mg | Freq: Once | INTRAMUSCULAR | Status: AC
  Administered 2021-03-29: 1 mg via INTRAVENOUS
  Filled 2021-03-29: qty 1

## 2021-03-29 MED ORDER — FENTANYL CITRATE (PF) 100 MCG/2ML IJ SOLN
50.0000 ug | Freq: Once | INTRAMUSCULAR | Status: AC
Start: 2021-03-29 — End: 2021-03-29
  Administered 2021-03-29: 50 ug via INTRAVENOUS
  Filled 2021-03-29: qty 2

## 2021-03-29 MED ORDER — FLUCONAZOLE 150 MG PO TABS
150.0000 mg | ORAL_TABLET | Freq: Once | ORAL | Status: AC
  Administered 2021-03-29: 150 mg via ORAL
  Filled 2021-03-29: qty 1

## 2021-03-29 MED ORDER — HYDROCODONE-ACETAMINOPHEN 5-325 MG PO TABS
1.0000 | ORAL_TABLET | ORAL | 0 refills | Status: AC | PRN
  Filled 2021-03-29: qty 15, 2d supply, fill #0

## 2021-03-29 MED ORDER — ONDANSETRON HCL 4 MG/2ML IJ SOLN
4.0000 mg | Freq: Once | INTRAMUSCULAR | Status: AC
  Administered 2021-03-29: 4 mg via INTRAVENOUS
  Filled 2021-03-29: qty 2

## 2021-03-29 MED ORDER — SODIUM CHLORIDE 0.9 % IV BOLUS
500.0000 mL | Freq: Once | INTRAVENOUS | Status: AC
  Administered 2021-03-29: 500 mL via INTRAVENOUS

## 2021-03-29 NOTE — ED Notes (Signed)
Report given to Michael, RN.

## 2021-03-29 NOTE — Discharge Instructions (Signed)
Follow-up with your OB/GYN tomorrow.  Return here as needed if you have any worsening symptoms.

## 2021-03-29 NOTE — ED Provider Notes (Addendum)
[facility] Provider Note   CSN: [id number] Arrival date & time: 03/29/21  4128     History Chief Complaint  Patient presents with  . Pelvic Pain  . Back Pain    Alison Scott is a 40 y.o. female.  Patient is a 40 year old female who presents with lower abdominal pain.  She states it started 2 days ago and is progressively gotten worse.  It is in the middle of her lower abdomen and radiates to the sides and her back.  She has had some associated nausea and vomiting.  No fevers.  No urinary symptoms.  No change in bowels.  She has had a prior C-section and says it sore down around the area of her C-section.  No vaginal complaints.  She took some Tylenol PM without improvement in symptoms.        Past Medical History:  Diagnosis Date  . Hyperthyroidism     Patient Active Problem List   Diagnosis Date Noted  . Hyperthyroidism     Past Surgical History:  Procedure Laterality Date  . CESAREAN SECTION    . HERNIA REPAIR    . IR RADIOLOGIST EVAL & MGMT  11/05/2019  . THYROIDECTOMY       OB History   No obstetric history on file.     No family history on file.  Social History   Tobacco Use  . Smoking status: Former Research scientist (life sciences)  . Smokeless tobacco: Former Network engineer Use Topics  . Alcohol use: Yes    Comment: occasionally  . Drug use: No    Home Medications Prior to Admission medications   Medication Sig Start Date End Date Taking? Authorizing Provider  HYDROcodone-acetaminophen (NORCO/VICODIN) 5-325 MG tablet Take 1-2 tablets by mouth every 4 (four) hours as needed. 03/29/21  Yes Malvin Johns, MD  levothyroxine (SYNTHROID, LEVOTHROID) 150 MCG tablet Take 150 mcg by mouth every morning. 01/28/17   [provider]    Allergies    Percocet [oxycodone-acetaminophen]  Review of Systems   Review of Systems  Constitutional: Negative for chills, diaphoresis, fatigue and fever.  HENT: Negative for congestion,  rhinorrhea and sneezing.   Eyes: Negative.   Respiratory: Negative for cough, chest tightness and shortness of breath.   Cardiovascular: Negative for chest pain and leg swelling.  Gastrointestinal: Positive for abdominal pain, nausea and vomiting. Negative for blood in stool and diarrhea.  Genitourinary: Negative for difficulty urinating, flank pain, frequency, hematuria, vaginal bleeding and vaginal discharge.  Musculoskeletal: Negative for arthralgias and back pain.  Skin: Negative for rash.  Neurological: Negative for dizziness, speech difficulty, weakness, numbness and headaches.    Physical Exam Updated Vital Signs BP 114/83   Pulse 96   Temp 99.2 F (37.3 C) (Oral)   Resp 18   Ht 5\' 3"  (1.6 m)   Wt 79.4 kg   LMP 03/16/2021   SpO2 99%   BMI 31.00 kg/m   Physical Exam Constitutional:      Appearance: She is well-developed.     Comments: Appears uncomfortable  HENT:     Head: Normocephalic and atraumatic.  Eyes:     Pupils: Pupils are equal, round, and reactive to light.  Cardiovascular:     Rate and Rhythm: Normal rate and regular rhythm.     Heart sounds: Normal heart sounds.  Pulmonary:     Effort: Pulmonary effort is normal. No respiratory distress.     Breath sounds: Normal breath sounds. No wheezing or rales.  Chest:     Chest wall: No tenderness.  Abdominal:     General: Bowel sounds are normal.     Palpations: Abdomen is soft.     Tenderness: There is abdominal tenderness (Positive tenderness to the suprapubic area and across the lower quadrants bilaterally ). There is no guarding or rebound.     Comments: Some firmness to her suprapubic area  Genitourinary:    Comments: Positive thick white-yellow discharge.  There is no significant cervical motion tenderness.  There is some tenderness over the uterus.  No adnexal tenderness Musculoskeletal:        General: Normal range of motion.     Cervical back: Normal range of motion and neck supple.   Lymphadenopathy:     Cervical: No cervical adenopathy.  Skin:    General: Skin is warm and dry.     Findings: No rash.  Neurological:     Mental Status: She is alert and oriented to person, place, and time.     ED Results / Procedures / Treatments   Labs (all labs ordered are listed, but only abnormal results are displayed) Labs Reviewed  WET PREP, GENITAL - Abnormal; Notable for the following components:      Result Value   Yeast Wet Prep HPF POC PRESENT (*)    WBC, Wet Prep HPF POC MANY (*)    All other components within normal limits  COMPREHENSIVE METABOLIC PANEL - Abnormal; Notable for the following components:   Sodium 134 (*)    CO2 20 (*)    Glucose, Bld 126 (*)    BUN 5 (*)    Calcium 8.7 (*)    All other components within normal limits  CBC WITH DIFFERENTIAL/PLATELET - Abnormal; Notable for the following components:   WBC 17.8 (*)    Hemoglobin 11.5 (*)    HCT 35.6 (*)    RDW 16.5 (*)    Neutro Abs 14.4 (*)    Monocytes Absolute 2.0 (*)    Abs Immature Granulocytes 0.08 (*)    All other components within normal limits  URINALYSIS, ROUTINE W REFLEX MICROSCOPIC - Abnormal; Notable for the following components:   Specific Gravity, Urine >1.030 (*)    Hgb urine dipstick TRACE (*)    Ketones, ur 40 (*)    All other components within normal limits  URINALYSIS, MICROSCOPIC (REFLEX) - Abnormal; Notable for the following components:   Bacteria, UA FEW (*)    All other components within normal limits  LIPASE, BLOOD  PREGNANCY, URINE  GC/CHLAMYDIA PROBE AMP ([place]) NOT AT [facility]    EKG None  Radiology CT Renal Stone Study  Result Date: 03/29/2021 CLINICAL DATA:  Pelvic pain in the region of a C-section incision site from 11 years ago. The pain radiates into the lower back. EXAM: CT ABDOMEN AND PELVIS WITHOUT CONTRAST TECHNIQUE: Multidetector CT imaging of the abdomen and pelvis was performed following the standard protocol without IV contrast. COMPARISON:   12/01/2017 FINDINGS: Lower chest: Unremarkable. Hepatobiliary: No focal liver abnormality is seen. No gallstones, gallbladder wall thickening, or biliary dilatation. Pancreas: Unremarkable. No pancreatic ductal dilatation or surrounding inflammatory changes. Spleen: Normal in size without focal abnormality. Adrenals/Urinary Tract: Small amount of left adrenal calcification, unchanged. The previously seen 1.3 cm left adrenal nodule is not visualized today. Normal appearing right adrenal gland. Unremarkable kidneys, ureters and urinary bladder. Stomach/Bowel: Stomach is within normal limits. Appendix appears normal. No evidence of bowel wall thickening, distention, or inflammatory changes. Vascular/Lymphatic: No significant  vascular findings are present. No enlarged abdominal or pelvic lymph nodes. Reproductive: The previously demonstrated 6.2 cm left uterine mass is larger, currently measuring 10.2 cm in corresponding diameter. No adnexal masses. Other: No abdominal wall hernia or abnormality. No abdominopelvic ascites. Musculoskeletal: Minimal lower thoracic spine degenerative changes. IMPRESSION: 1. No acute abnormality. 2. Interval increase in size of the previously demonstrated left uterine mass, currently measuring 10.2 cm in maximum diameter and extends anteriorly, causing anterior protrusion of the anterior abdominal wall. This remains most compatible with a large fibroid. Electronically Signed   By: Claudie Revering M.D.   On: 03/29/2021 09:52   US PELVIC COMPLETE W TRANSVAGINAL AND TORSION R/O  Result Date: 03/29/2021 CLINICAL DATA:  Pelvic pain, enlarging uterine fibroid by CT EXAM: TRANSABDOMINAL AND TRANSVAGINAL ULTRASOUND OF PELVIS DOPPLER ULTRASOUND OF OVARIES TECHNIQUE: Both transabdominal and transvaginal ultrasound examinations of the pelvis were performed. Transabdominal technique was performed for global imaging of the pelvis including uterus, ovaries, adnexal regions, and pelvic cul-de-sac. It was  necessary to proceed with endovaginal exam following the transabdominal exam to visualize the uterus and adnexae in better detail. Color and duplex Doppler ultrasound was utilized to evaluate blood flow to the ovaries. COMPARISON:  03/29/2021 CT without contrast, 11/12/2019 pelvic MRI FINDINGS: Uterus Measurements: 12.2 x 9.8 x 10.2 cm = volume: 638 mL. Heterogeneous solid and mixed echogenicity uterine mass roughly measuring 11.7 x 9 x 4 x 9.8 cm by ultrasound, compatible with an enlarging submucosal fibroid. Previously by MRI, the fibroid measures 6.8 x 5.4 x 7.4 cm. Benign cervical nabothian cysts noted. Endometrium Thickness: Approximately 12 mm. Limited assessment of the endometrium related to the fibroid Right ovary Measurements: 2.8 x 1.7 x 2.0 cm = volume: 4.9 mL. Normal appearance/no adnexal mass. Left ovary Not visualized Pulsed Doppler evaluation of the right ovary demonstrates normal low-resistance arterial and venous waveforms. Left ovary not visualized or assessed with Doppler. Other findings No abnormal free fluid. IMPRESSION: Interval enlargement of the dominant solitary submucosal uterine fibroid now measuring 11.7 cm by ultrasound, previously 6.8 cm by comparison MRI. Normal right ovary without torsion Nonvisualization of the left ovary No free fluid Electronically Signed   By: Jerilynn Mages.  Shick M.D.   On: 03/29/2021 11:36    Procedures Procedures   Medications Ordered in ED Medications  fluconazole (DIFLUCAN) tablet 150 mg (has no administration in time range)  fentaNYL (SUBLIMAZE) injection 50 mcg (50 mcg Intravenous Given 03/29/21 0909)  ondansetron (ZOFRAN) injection 4 mg (4 mg Intravenous Given 03/29/21 0909)  sodium chloride 0.9 % bolus 500 mL (0 mLs Intravenous Stopped 03/29/21 1020)  morphine 4 MG/ML injection 4 mg (4 mg Intravenous Given 03/29/21 0936)  morphine 4 MG/ML injection 4 mg (4 mg Intravenous Given 03/29/21 1020)  HYDROmorphone (DILAUDID) injection 1 mg (1 mg Intravenous Given  03/29/21 1255)  ondansetron (ZOFRAN) injection 4 mg (4 mg Intravenous Given 03/29/21 1254)    ED Course  I have reviewed the triage vital signs and the nursing notes.  Pertinent labs & imaging results that were available during my care of the patient were reviewed by me and considered in my medical decision making (see chart for details).    MDM Rules/Calculators/A&P                          Patient is a 40 year old female who presents with suprapubic pain.  She initially was in quite severe pain.  I did a CT scan to rule out  kidney stone or other pathology.  It was negative other than a large uterine fibroid that has grown since her last imaging.  It is now about 10 cm.  She had a pelvic ultrasound which showed a uterine fibroid but otherwise unremarkable.  She does have an elevated WBC count but would not see any suggestions of infections on the imaging studies.  She is afebrile.  She initially was quite uncomfortable.  I did consult her gynecologist, Dr. Garwin Brothers.  Dr. Garwin Brothers advised that this is not something that she would admit to me to the hospital for and advised that she did not have anything else to offer as far as her management.  She was given another dose of pain medicine and actually was feeling more comfortable after that.  Given her improvement in symptoms, I feel that she can have an outpatient trial.  She will follow up with Dr. Garwin Brothers tomorrow.  She says her pain feels similar to her uterine fibroid pain but it is much more severe today.  She was given strict return precautions to return if she has worsening pain, fevers, vomiting or other worsening symptoms.  She was given a short course of Vicodin. Final Clinical Impression(s) / ED Diagnoses Final diagnoses:  Pelvic pain  Uterine leiomyoma, unspecified location    Rx / DC Orders ED Discharge Orders         Ordered    HYDROcodone-acetaminophen (NORCO/VICODIN) 5-325 MG tablet  Every 4 hours PRN        03/29/21 1402            Malvin Johns, MD 03/29/21 1407    Malvin Johns, MD 03/29/21 1410

## 2021-03-29 NOTE — ED Triage Notes (Signed)
Reports pelvic pain that radiates into the lower back for the last few days.  Reports pain is around incision site from c-section 11 years ago.  Also endorses n/v.  Denies any diarrhea.

## 2021-03-30 ENCOUNTER — Telehealth (HOSPITAL_BASED_OUTPATIENT_CLINIC_OR_DEPARTMENT_OTHER): Payer: Self-pay

## 2021-03-30 ENCOUNTER — Other Ambulatory Visit: Payer: Self-pay

## 2021-03-30 ENCOUNTER — Inpatient Hospital Stay (HOSPITAL_COMMUNITY)
Admission: AD | Admit: 2021-03-30 | Discharge: 2021-04-02 | DRG: 761 | Disposition: A | Discharge status: Home / Self Care | Payer: BC Managed Care – PPO | Attending: Obstetrics and Gynecology | Admitting: Obstetrics and Gynecology

## 2021-03-30 ENCOUNTER — Other Ambulatory Visit: Payer: Self-pay | Admitting: Obstetrics and Gynecology

## 2021-03-30 DIAGNOSIS — Z20822 Contact with and (suspected) exposure to covid-19: Secondary | ICD-10-CM | POA: Diagnosis present

## 2021-03-30 DIAGNOSIS — R102 Pelvic and perineal pain: Secondary | ICD-10-CM | POA: Diagnosis present

## 2021-03-30 DIAGNOSIS — D259 Leiomyoma of uterus, unspecified: Secondary | ICD-10-CM | POA: Diagnosis present

## 2021-03-30 DIAGNOSIS — K5900 Constipation, unspecified: Secondary | ICD-10-CM | POA: Diagnosis present

## 2021-03-30 DIAGNOSIS — E890 Postprocedural hypothyroidism: Secondary | ICD-10-CM | POA: Diagnosis present

## 2021-03-30 DIAGNOSIS — Z87891 Personal history of nicotine dependence: Secondary | ICD-10-CM

## 2021-03-30 DIAGNOSIS — B373 Candidiasis of vulva and vagina: Secondary | ICD-10-CM | POA: Diagnosis present

## 2021-03-30 DIAGNOSIS — D251 Intramural leiomyoma of uterus: Principal | ICD-10-CM | POA: Diagnosis present

## 2021-03-30 DIAGNOSIS — D509 Iron deficiency anemia, unspecified: Secondary | ICD-10-CM | POA: Diagnosis present

## 2021-03-30 DIAGNOSIS — D72829 Elevated white blood cell count, unspecified: Secondary | ICD-10-CM | POA: Diagnosis present

## 2021-03-30 LAB — CBC WITH DIFFERENTIAL/PLATELET
Abs Immature Granulocytes: 0.09 10*3/uL — ABNORMAL HIGH (ref 0.00–0.07)
Basophils Absolute: 0.1 10*3/uL (ref 0.0–0.1)
Basophils Relative: 0 %
Eosinophils Absolute: 0.1 10*3/uL (ref 0.0–0.5)
Eosinophils Relative: 0 %
HCT: 33.5 % — ABNORMAL LOW (ref 36.0–46.0)
Hemoglobin: 10.7 g/dL — ABNORMAL LOW (ref 12.0–15.0)
Immature Granulocytes: 0 %
Lymphocytes Relative: 10 %
Lymphs Abs: 2.1 10*3/uL (ref 0.7–4.0)
MCH: 27.5 pg (ref 26.0–34.0)
MCHC: 31.9 g/dL (ref 30.0–36.0)
MCV: 86.1 fL (ref 80.0–100.0)
Monocytes Absolute: 1.7 10*3/uL — ABNORMAL HIGH (ref 0.1–1.0)
Monocytes Relative: 8 %
Neutro Abs: 16.5 10*3/uL — ABNORMAL HIGH (ref 1.7–7.7)
Neutrophils Relative %: 82 %
Platelets: 311 10*3/uL (ref 150–400)
RBC: 3.89 MIL/uL (ref 3.87–5.11)
RDW: 16.4 % — ABNORMAL HIGH (ref 11.5–15.5)
WBC: 20.5 10*3/uL — ABNORMAL HIGH (ref 4.0–10.5)
nRBC: 0 % (ref 0.0–0.2)

## 2021-03-30 LAB — GC/CHLAMYDIA PROBE AMP (~~LOC~~) NOT AT ARMC
Chlamydia: NEGATIVE
Comment: NEGATIVE
Comment: NORMAL
Neisseria Gonorrhea: NEGATIVE

## 2021-03-30 MED ORDER — ONDANSETRON HCL 4 MG/2ML IJ SOLN
4.0000 mg | Freq: Four times a day (QID) | INTRAMUSCULAR | Status: DC | PRN
  Filled 2021-03-30: qty 2

## 2021-03-30 MED ORDER — METRONIDAZOLE 500 MG PO TABS
500.0000 mg | ORAL_TABLET | Freq: Four times a day (QID) | ORAL | Status: DC
  Administered 2021-03-30 – 2021-04-01 (×6): 500 mg via ORAL
  Filled 2021-03-30 (×7): qty 1

## 2021-03-30 MED ORDER — KETOROLAC TROMETHAMINE 30 MG/ML IJ SOLN: 30.0000 mg | Freq: Four times a day (QID) | INTRAMUSCULAR | Status: DC

## 2021-03-30 MED ORDER — ONDANSETRON HCL 4 MG PO TABS
4.0000 mg | ORAL_TABLET | Freq: Four times a day (QID) | ORAL | Status: DC | PRN
  Administered 2021-03-31 (×2): 4 mg via ORAL
  Filled 2021-03-30 (×2): qty 1

## 2021-03-30 MED ORDER — SIMETHICONE 80 MG PO CHEW
80.0000 mg | CHEWABLE_TABLET | Freq: Four times a day (QID) | ORAL | Status: DC | PRN
  Administered 2021-04-01: 80 mg via ORAL
  Filled 2021-03-30: qty 1

## 2021-03-30 MED ORDER — PIPERACILLIN-TAZOBACTAM 3.375 G IVPB
3.3750 g | Freq: Three times a day (TID) | INTRAVENOUS | Status: DC
  Administered 2021-03-30 – 2021-04-02 (×8): 3.375 g via INTRAVENOUS
  Filled 2021-03-30 (×10): qty 50

## 2021-03-30 MED ORDER — GUAIFENESIN 100 MG/5ML PO SOLN: 15.0000 mL | ORAL | Status: DC | PRN

## 2021-03-30 MED ORDER — HYDROCODONE-ACETAMINOPHEN 5-325 MG PO TABS
1.0000 | ORAL_TABLET | Freq: Four times a day (QID) | ORAL | Status: DC | PRN
  Administered 2021-03-30 – 2021-04-01 (×4): 2 via ORAL
  Filled 2021-03-30 (×3): qty 2
  Filled 2021-03-30 (×2): qty 1

## 2021-03-30 MED ORDER — LACTATED RINGERS IV SOLN: INTRAVENOUS | Status: DC

## 2021-03-30 MED ORDER — PRENATAL MULTIVITAMIN CH
1.0000 | ORAL_TABLET | Freq: Every day | ORAL | Status: DC
  Administered 2021-03-31: 1 via ORAL
  Filled 2021-03-30: qty 1

## 2021-03-30 MED ORDER — KETOROLAC TROMETHAMINE 30 MG/ML IJ SOLN
30.0000 mg | Freq: Four times a day (QID) | INTRAMUSCULAR | Status: DC
  Administered 2021-03-30 – 2021-04-02 (×10): 30 mg via INTRAVENOUS
  Filled 2021-03-30 (×11): qty 1

## 2021-03-30 MED ORDER — IBUPROFEN 800 MG PO TABS: 800.0000 mg | ORAL_TABLET | Freq: Three times a day (TID) | ORAL | Status: DC | PRN

## 2021-03-30 MED ORDER — MENTHOL 3 MG MT LOZG: 1.0000 | LOZENGE | OROMUCOSAL | Status: DC | PRN

## 2021-03-30 NOTE — Telephone Encounter (Signed)
Patient called with c/o increased nausea with pain medication. Schlossman MD made aware and advised to call in 12 4 mg tabs of Zofran every 8 hours PRN for patient nausea. Called into [facility] per patient request. Patient states they have a planned follow up with OBGYN on Monday.

## 2021-03-30 NOTE — H&P (Signed)
Alison Scott is an 40 y.o. [id number] MBF now being admitted for management of pelvic pain with associated wbc 22K in a setting of presumed degenerating fibroid. Pt was seen at [facility] yesterday due to pain. She was found to have a wbc 17K, afebrile with pain. Only radiologic findings was enlarging  uterine fibroid, nl appendix  Per pt she was fine until 3 days prior when she had sudden onset of pain. Prior to that she was treated for sinus infection with antibiotics for 7 days. (+) nausea. BM ok, denies urinary sx. (-) G/c 4/12. Pt was seen in my office today with c/o continued pain not responding to vicodin and motrin. Repeat CBC was sent ( 22K wbc) and IM toradaol ( 60mg  ) given at 3: 30 pm. Given the escalation of her wbc , pt was advised of need for inpatient mgmt  Pertinent Gynecological History: Menses: flow is excessive with use of 5 pads or tampons on heaviest days Bleeding: dysfunctional uterine bleeding Contraception: none DES exposure: denies Blood transfusions: none Sexually transmitted diseases: no past history Previous GYN Procedures: DNC  Last mammogram: not as yet Last pap: normal Date: 2021 OB History: [id number]   Menstrual History: Menarche age: n/a Patient's last menstrual period was 03/16/2021.    Past Medical History:  Diagnosis Date  . Hyperthyroidism     Past Surgical History:  Procedure Laterality Date  . CESAREAN SECTION    . HERNIA REPAIR    . IR RADIOLOGIST EVAL & MGMT  11/05/2019  . THYROIDECTOMY      No family history on file.  Social History:  reports that she has quit smoking. She has quit using smokeless tobacco. She reports current alcohol use. She reports that she does not use drugs.  Allergies:  Allergies  Allergen Reactions  . Percocet [Oxycodone-Acetaminophen] Itching    Medications Prior to Admission  Medication Sig Dispense Refill Last Dose  . HYDROcodone-acetaminophen (NORCO/VICODIN) 5-325 MG tablet Take 1-2 tablets by  mouth every 4 (four) hours as needed. 15 tablet 0   . levothyroxine (SYNTHROID, LEVOTHROID) 150 MCG tablet Take 150 mcg by mouth every morning.  0     Review of Systems  Last menstrual period 03/16/2021. PE: WDWN BF  More comfortable post IM med earlier Skin no lesion  Heent: anicteric sclera, pale pink conjunctiva Lungs clear to A Cor RRR  abd soft  Nondistended. Tender to palp over fibroid  uterus 4FB above symphysis pubis pfannenstiel incision Pelvic deferred here( done in office): vulva  (+) labial erythema Vagina scant white d/c Cervix closed Uterus 14 wk tender Bladder tender  No results found for this or any previous visit (from the past 24 hour(s)).  CT Renal Stone Study  Result Date: 03/29/2021 CLINICAL DATA:  Pelvic pain in the region of a C-section incision site from 11 years ago. The pain radiates into the lower back. EXAM: CT ABDOMEN AND PELVIS WITHOUT CONTRAST TECHNIQUE: Multidetector CT imaging of the abdomen and pelvis was performed following the standard protocol without IV contrast. COMPARISON:  12/01/2017 FINDINGS: Lower chest: Unremarkable. Hepatobiliary: No focal liver abnormality is seen. No gallstones, gallbladder wall thickening, or biliary dilatation. Pancreas: Unremarkable. No pancreatic ductal dilatation or surrounding inflammatory changes. Spleen: Normal in size without focal abnormality. Adrenals/Urinary Tract: Small amount of left adrenal calcification, unchanged. The previously seen 1.3 cm left adrenal nodule is not visualized today. Normal appearing right adrenal gland. Unremarkable kidneys, ureters and urinary bladder. Stomach/Bowel: Stomach is within normal limits. Appendix appears normal.  No evidence of bowel wall thickening, distention, or inflammatory changes. Vascular/Lymphatic: No significant vascular findings are present. No enlarged abdominal or pelvic lymph nodes. Reproductive: The previously demonstrated 6.2 cm left uterine mass is larger,  currently measuring 10.2 cm in corresponding diameter. No adnexal masses. Other: No abdominal wall hernia or abnormality. No abdominopelvic ascites. Musculoskeletal: Minimal lower thoracic spine degenerative changes. IMPRESSION: 1. No acute abnormality. 2. Interval increase in size of the previously demonstrated left uterine mass, currently measuring 10.2 cm in maximum diameter and extends anteriorly, causing anterior protrusion of the anterior abdominal wall. This remains most compatible with a large fibroid. Electronically Signed   By: Claudie Revering M.D.   On: 03/29/2021 09:52   US PELVIC COMPLETE W TRANSVAGINAL AND TORSION R/O  Result Date: 03/29/2021 CLINICAL DATA:  Pelvic pain, enlarging uterine fibroid by CT EXAM: TRANSABDOMINAL AND TRANSVAGINAL ULTRASOUND OF PELVIS DOPPLER ULTRASOUND OF OVARIES TECHNIQUE: Both transabdominal and transvaginal ultrasound examinations of the pelvis were performed. Transabdominal technique was performed for global imaging of the pelvis including uterus, ovaries, adnexal regions, and pelvic cul-de-sac. It was necessary to proceed with endovaginal exam following the transabdominal exam to visualize the uterus and adnexae in better detail. Color and duplex Doppler ultrasound was utilized to evaluate blood flow to the ovaries. COMPARISON:  03/29/2021 CT without contrast, 11/12/2019 pelvic MRI FINDINGS: Uterus Measurements: 12.2 x 9.8 x 10.2 cm = volume: 638 mL. Heterogeneous solid and mixed echogenicity uterine mass roughly measuring 11.7 x 9 x 4 x 9.8 cm by ultrasound, compatible with an enlarging submucosal fibroid. Previously by MRI, the fibroid measures 6.8 x 5.4 x 7.4 cm. Benign cervical nabothian cysts noted. Endometrium Thickness: Approximately 12 mm. Limited assessment of the endometrium related to the fibroid Right ovary Measurements: 2.8 x 1.7 x 2.0 cm = volume: 4.9 mL. Normal appearance/no adnexal mass. Left ovary Not visualized Pulsed Doppler evaluation of the right  ovary demonstrates normal low-resistance arterial and venous waveforms. Left ovary not visualized or assessed with Doppler. Other findings No abnormal free fluid. IMPRESSION: Interval enlargement of the dominant solitary submucosal uterine fibroid now measuring 11.7 cm by ultrasound, previously 6.8 cm by comparison MRI. Normal right ovary without torsion Nonvisualization of the left ovary No free fluid Electronically Signed   By: Jerilynn Mages.  Shick M.D.   On: 03/29/2021 11:36    Assessment/Plan: Degenerating Fibroid Leucocytosis Pelvic/abdominal pain Previous C/S Hypothyroidism Not use to degenerating fibroid cause this amount of elevation of wbc but don't have any other source. Per pt she had colonoscopy which was fine  P) OBS. IV Zosyn, oral flagyl. Pain med . Repeat cbc today and in am. Cont thyroid med.   Shila Kruczek A Audley Hinojos 03/30/2021, 7:09 PM

## 2021-03-31 DIAGNOSIS — Z87891 Personal history of nicotine dependence: Secondary | ICD-10-CM | POA: Diagnosis not present

## 2021-03-31 DIAGNOSIS — B373 Candidiasis of vulva and vagina: Secondary | ICD-10-CM | POA: Diagnosis present

## 2021-03-31 DIAGNOSIS — E890 Postprocedural hypothyroidism: Secondary | ICD-10-CM | POA: Diagnosis present

## 2021-03-31 DIAGNOSIS — Z20822 Contact with and (suspected) exposure to covid-19: Secondary | ICD-10-CM | POA: Diagnosis present

## 2021-03-31 DIAGNOSIS — K5900 Constipation, unspecified: Secondary | ICD-10-CM | POA: Diagnosis present

## 2021-03-31 DIAGNOSIS — D72829 Elevated white blood cell count, unspecified: Secondary | ICD-10-CM | POA: Diagnosis present

## 2021-03-31 DIAGNOSIS — R102 Pelvic and perineal pain: Secondary | ICD-10-CM | POA: Diagnosis present

## 2021-03-31 DIAGNOSIS — D509 Iron deficiency anemia, unspecified: Secondary | ICD-10-CM | POA: Diagnosis present

## 2021-03-31 DIAGNOSIS — D251 Intramural leiomyoma of uterus: Secondary | ICD-10-CM | POA: Diagnosis present

## 2021-03-31 LAB — CBC
HCT: 31.7 % — ABNORMAL LOW (ref 36.0–46.0)
Hemoglobin: 10.2 g/dL — ABNORMAL LOW (ref 12.0–15.0)
MCH: 27.3 pg (ref 26.0–34.0)
MCHC: 32.2 g/dL (ref 30.0–36.0)
MCV: 85 fL (ref 80.0–100.0)
Platelets: 284 10*3/uL (ref 150–400)
RBC: 3.73 MIL/uL — ABNORMAL LOW (ref 3.87–5.11)
RDW: 16.3 % — ABNORMAL HIGH (ref 11.5–15.5)
WBC: 16 10*3/uL — ABNORMAL HIGH (ref 4.0–10.5)
nRBC: 0 % (ref 0.0–0.2)

## 2021-03-31 LAB — SARS CORONAVIRUS 2 (TAT 6-24 HRS): SARS Coronavirus 2: NEGATIVE

## 2021-03-31 MED ORDER — FLUCONAZOLE 150 MG PO TABS
150.0000 mg | ORAL_TABLET | Freq: Every day | ORAL | Status: DC
  Administered 2021-03-31 – 2021-04-01 (×2): 150 mg via ORAL
  Filled 2021-03-31 (×2): qty 1

## 2021-03-31 MED ORDER — LEVOTHYROXINE SODIUM 25 MCG PO TABS
100.0000 ug | ORAL_TABLET | Freq: Every day | ORAL | Status: DC
  Administered 2021-03-31 – 2021-04-02 (×3): 100 ug via ORAL
  Filled 2021-03-31 (×3): qty 4

## 2021-03-31 MED ORDER — POLYETHYLENE GLYCOL 3350 17 G PO PACK
17.0000 g | PACK | Freq: Every day | ORAL | Status: DC | PRN
  Administered 2021-03-31: 17 g via ORAL
  Filled 2021-03-31: qty 1

## 2021-03-31 NOTE — Progress Notes (Signed)
HD #1 S;  Waiting to eat breakfast Pain rated a 6/10  O; BP (!) 92/48 (BP Location: Left Arm)   Pulse 79   Temp 98.8 F (37.1 C) (Oral)   Resp 17   Ht 5\' 3"  (1.6 m)   Wt 77.1 kg   LMP 03/16/2021   SpO2 98%   BMI 30.11 kg/m  Lungs clear to A Cor RRR  abd: uterus tender 3 FB below umb Pad none  Extr no edema or calf tenderness   CBC Latest Ref Rng & Units 03/30/2021 03/29/2021 12/01/2017  WBC 4.0 - 10.5 K/uL 20.5(H) 17.8(H) 11.5(H)  Hemoglobin 12.0 - 15.0 g/dL 10.7(L) 11.5(L) 12.4  Hematocrit 36.0 - 46.0 % 33.5(L) 35.6(L) 37.2  Platelets 150 - 400 K/uL 311 307 303   IMP: Degenerating fibroid with associated leucocytosis on Zosyn/Flagyl Hypothyroidism Previous C/S x 3 P) cont IV antibiotics. IV hydration. Cont with pain med( Toradol around the clock and vicodin) Cont inpt

## 2021-04-01 ENCOUNTER — Encounter (HOSPITAL_COMMUNITY): Payer: Self-pay | Admitting: Obstetrics and Gynecology

## 2021-04-01 MED ORDER — SENNOSIDES-DOCUSATE SODIUM 8.6-50 MG PO TABS
2.0000 | ORAL_TABLET | Freq: Once | ORAL | Status: AC
  Administered 2021-04-01: 2 via ORAL
  Filled 2021-04-01: qty 2

## 2021-04-01 MED ORDER — PROMETHAZINE HCL 25 MG PO TABS: 25.0000 mg | ORAL_TABLET | Freq: Four times a day (QID) | ORAL | Status: DC | PRN

## 2021-04-01 MED ORDER — BISACODYL 10 MG RE SUPP
10.0000 mg | Freq: Once | RECTAL | Status: AC
  Administered 2021-04-01: 10 mg via RECTAL
  Filled 2021-04-01: qty 1

## 2021-04-01 MED ORDER — NYSTATIN-TRIAMCINOLONE 100000-0.1 UNIT/GM-% EX CREA
TOPICAL_CREAM | Freq: Two times a day (BID) | CUTANEOUS | Status: DC
  Administered 2021-04-01: 1 via TOPICAL
  Filled 2021-04-01: qty 15

## 2021-04-01 MED ORDER — METRONIDAZOLE IN NACL 5-0.79 MG/ML-% IV SOLN
500.0000 mg | Freq: Three times a day (TID) | INTRAVENOUS | Status: DC
  Administered 2021-04-01 – 2021-04-02 (×3): 500 mg via INTRAVENOUS
  Filled 2021-04-01 (×4): qty 100

## 2021-04-01 MED ORDER — SODIUM CHLORIDE 0.9 % IV SOLN
25.0000 mg | Freq: Four times a day (QID) | INTRAVENOUS | Status: DC | PRN
  Administered 2021-04-01 (×2): 25 mg via INTRAVENOUS
  Filled 2021-04-01 (×3): qty 1

## 2021-04-01 NOTE — Progress Notes (Signed)
HD #2  S: multiple complaints  c/o rough night. (+) n/v with flagyl pill Refuse flagyl orally Requesting IV Flagyl Also c/o feeling bloated as well as feels like  Need to have BM Pain is better per pt Concern regarding not knowing cause of her problem C/o bleeding from crack in butt Took Miralax yesterday No BM (+) flatus  O: BP 108/64 (BP Location: Right Arm)   Pulse 68   Temp 98.3 F (36.8 C) (Oral)   Resp 18   Ht 5\' 3"  (1.6 m)   Wt 77.1 kg   LMP 03/16/2021   SpO2 99%   BMI 30.11 kg/m  Lung clear to A  Cor RRR  abd; soft active BS uterus 3FB below umb less tender on right Pelvic: vulvar erythematous rash Buttock upper superficial cut/fissure  CBC Latest Ref Rng & Units 03/31/2021 03/30/2021 03/29/2021  WBC 4.0 - 10.5 K/uL 16.0(H) 20.5(H) 17.8(H)  Hemoglobin 12.0 - 15.0 g/dL 10.2(L) 10.7(L) 11.5(L)  Hematocrit 36.0 - 46.0 % 31.7(L) 33.5(L) 35.6(L)  Platelets 150 - 400 K/uL 284 311 307  IMP;  Degenerating fibroid with pain improving and leukocytosis improving on Zosyn/Flagyl Hypothyroidism on synthroid  constipation  Candida vulvitis P) change to IV Flagyl.  D/c zofran since side effect is constipation. Change to phenergan Senna-ducosate and dulcolax rectal suppository Cont IV antibiotic.  Cont pain med Recheck CBC in am

## 2021-04-02 LAB — CBC
HCT: 28.9 % — ABNORMAL LOW (ref 36.0–46.0)
Hemoglobin: 9.2 g/dL — ABNORMAL LOW (ref 12.0–15.0)
MCH: 27.2 pg (ref 26.0–34.0)
MCHC: 31.8 g/dL (ref 30.0–36.0)
MCV: 85.5 fL (ref 80.0–100.0)
Platelets: 365 10*3/uL (ref 150–400)
RBC: 3.38 MIL/uL — ABNORMAL LOW (ref 3.87–5.11)
RDW: 16.3 % — ABNORMAL HIGH (ref 11.5–15.5)
WBC: 6.5 10*3/uL (ref 4.0–10.5)
nRBC: 0 % (ref 0.0–0.2)

## 2021-04-02 MED ORDER — TRAMADOL HCL 50 MG PO TABS: 50.0000 mg | ORAL_TABLET | Freq: Four times a day (QID) | ORAL | 0 refills | Status: AC | PRN

## 2021-04-02 MED ORDER — SODIUM CHLORIDE 0.9% FLUSH: 3.0000 mL | Freq: Two times a day (BID) | INTRAVENOUS | Status: DC

## 2021-04-02 MED ORDER — SODIUM CHLORIDE 0.9% FLUSH: 3.0000 mL | INTRAVENOUS | Status: DC | PRN

## 2021-04-02 MED ORDER — SODIUM CHLORIDE 0.9 % IV SOLN: 250.0000 mL | INTRAVENOUS | Status: DC | PRN

## 2021-04-02 MED ORDER — POLYSACCHARIDE IRON COMPLEX 150 MG PO CAPS: 150.0000 mg | ORAL_CAPSULE | Freq: Every day | ORAL | 11 refills | Status: AC

## 2021-04-02 NOTE — Discharge Summary (Signed)
Physician Discharge Summary  Patient ID: Alison Scott MRN: [id number] DOB/AGE: May 31, 1981 40 y.o.  Admit date: 03/30/2021 Discharge date: 04/02/2021  Admission Diagnoses: leukocytosis, pelvic pain,  presumed degenerating uterine fibroid, hypothyroidism Discharge Diagnoses:  Principal Problem:   Leucocytosis Active Problems:   Intramural uterine fibroid   Pelvic pain in female   Fibroid uterus   Pelvic pain   Discharged Condition: stable  Hospital Course: pt was admitted to hospital based on elevated wbc for which cause/source unclear . Pt was empirically started on IV Zosyn and oral flagyl .pt has known large uterine fibroid with assoc pain and therefore presumed degenerating. Flagyl was changed to IV route due to c/o nausea,. Pt had issue of  Nausea prior to hospital. Serial wbc was obtained and normalized to 6.5 along with pain improved. Given  no source for wbc  and risk for other oral antibiotics, will d/ home and monitor  for temp . Pt had BM  .  Consults: None  Significant Diagnostic Studies: labs:  CBC Latest Ref Rng & Units 04/02/2021 03/31/2021 03/30/2021  WBC 4.0 - 10.5 K/uL 6.5 16.0(H) 20.5(H)  Hemoglobin 12.0 - 15.0 g/dL 9.2(L) 10.2(L) 10.7(L)  Hematocrit 36.0 - 46.0 % 28.9(L) 31.7(L) 33.5(L)  Platelets 150 - 400 K/uL 365 284 311     Treatments: IV hydration, antibiotics: Zosyn and metronidazole, and analgesia: vicodin, toradol  Discharge Exam: Blood pressure 124/75, pulse 65, temperature 98 F (36.7 C), temperature source Oral, resp. rate 16, height 5\' 3"  (1.6 m), weight 77.1 kg, last menstrual period 03/16/2021, SpO2 99 %. General appearance: alert, cooperative, and no distress Resp: clear to auscultation bilaterally Cardio: regular rate and rhythm, S1, S2 normal, no murmur, click, rub or gallop GI: soft non distended active BS. Uterus 3 FB below umb min tender. Pfannenstiel incision Pelvic: deferred Extremities: no edema or calf tenderness Skin: warm  dry  Disposition: Discharge disposition: 01-Home or Self Care       Discharge Instructions     Activity as tolerated - No restrictions   Complete by: As directed    Call MD for:  persistant nausea and vomiting   Complete by: As directed    Call MD for:  severe uncontrolled pain   Complete by: As directed    Call MD for:  temperature >100.4   Complete by: As directed    Diet general   Complete by: As directed    May walk up steps   Complete by: As directed       Allergies as of 04/02/2021       Reactions   Percocet [oxycodone-acetaminophen] Itching        Medication List     TAKE these medications    HYDROcodone-acetaminophen 5-325 MG tablet Commonly known as: NORCO/VICODIN Take 1-2 tablets by mouth every 4 (four) hours as needed.   iron polysaccharides 150 MG capsule Commonly known as: NIFEREX Take 1 capsule (150 mg total) by mouth daily.   levothyroxine 150 MCG tablet Commonly known as: SYNTHROID Take 150 mcg by mouth every morning.   traMADol 50 MG tablet Commonly known as: Ultram Take 1 tablet (50 mg total) by mouth every 6 (six) hours as needed for up to 5 days.        Follow-up Information     Servando Salina, MD Follow up.   Specialty: Obstetrics and Gynecology Why: will call pt regarding referral to [facility] minimal invasive clinic Contact information: [address] [place] [address]  [phone]                 Signed: Alanda Slim A Norvil Martensen 04/02/2021, 10:37 AM

## 2021-04-02 NOTE — Discharge Instructions (Signed)
Uterine Fibroids  Uterine fibroids are lumps of tissue (tumors) in the womb (uterus). Fibroids are not cancerous. Most women with this condition do not need treatment. Sometimes, fibroids can make it harder to have children. If this happens, you may need surgery to take out the fibroids. What are the causes? The cause of this condition is not known. What increases the risk?  You are in your 30s or 40s and have not gone through menopause. Menopause is when you have not had a menstrual period for 12 months.  Having a history of fibroids in your family.  You are of African American descent.  You started your period at age 4 or younger.  You have not given birth.  You are overweight or very overweight. What are the signs or symptoms?  Bleeding between menstrual periods.  Heavy bleeding during your menstrual period.  Pain in the area between your hips.  Needing to pee (urinate) right away or more often than usual.  Not being able to have children (infertility).  Not being able to stay pregnant (miscarriage). Many women do not have symptoms.  How is this treated? Treatment may include:  Follow-up visits with your doctor to check your fibroids for any changes.  Medicines to help with pain, such as aspirin or ibuprofen.  Hormone therapy. This may be given as a pill, in a shot, or with a type of birth control device called an IUD.  Surgery that would do one of these things: ? Take out the fibroids. This may be done if you want to become pregnant. ? Take out the womb (hysterectomy). ? Stop the blood flow to the fibroids. Follow these instructions at home: Medicines  Take over-the-counter and prescription medicines only as told by your doctor.  Ask your doctor if you should: ? Take iron pills. ? Eat more foods that have a lot of iron in them, such as dark green, leafy vegetables. Managing pain If told, put heat on your back or belly. Do this as often as told by your  doctor. Use the heat source that your doctor recommends, such as a moist heat pack or a heating pad. To do this:  Put a towel between your skin and the heat pack or pad.  Leave the heat on for 20-30 minutes.  Take off the heat if your skin turns bright red. This is very important. If you cannot feel pain, heat, or cold, you may have a greater risk of getting burned.   General instructions  Tell your doctor about any changes to your menstrual period, such as: ? Heavy bleeding that needs a change of tampons or pads more than normal. ? A change in how many days your period lasts. ? A change in symptoms that come with your period. This might be belly cramps or back pain.  Keep all follow-up visits. Contact a doctor if:  You have pain that does not get better with medicine or heat. This may include pain or cramps in: ? The area between your hip bones. ? Your back. ? Your belly.  You have new bleeding between your periods.  You have more bleeding during or between your periods.  You feel very tired or weak.  You feel dizzy. Get help right away if:  You faint.  You have pain in the area between your hip bones that gets worse.  You have bleeding that soaks a tampon or pad in 30 minutes or less. Summary  Uterine fibroids are lumps of  tissue (tumors) in your womb. They are not cancerous.  Medicines such as aspirin or ibuprofen may be used to help with pain.  Contact a doctor if you have pain or cramps that do not get better with medicine.  Know the symptoms for when you should get help right away. This information is not intended to replace advice given to you by your health care provider. Make sure you discuss any questions you have with your health care provider. Document Revised: 07/06/2020 Document Reviewed: 07/06/2020 Elsevier Patient Education  [place].

## 2021-04-02 NOTE — Plan of Care (Signed)

## 2021-04-02 NOTE — Progress Notes (Signed)
HD # 3  S: had BM C/o phenergan knocked her out Declines med for nausea. Thinks nausea also due to vicodin.  Pt had nausea prior to hospital Did not use mycolog II ointment Pain  much better  O:  Patient Vitals for the past 24 hrs:  BP Temp Temp src Pulse Resp SpO2  04/02/21 0830 124/75 98 F (36.7 C) Oral 65 16 99 %  04/02/21 0423 138/79 99 F (37.2 C) Oral 75 15 99 %  04/01/21 2342 135/82 99.4 F (37.4 C) Oral 78 14 97 %  04/01/21 1944 112/68 99.4 F (37.4 C) Oral 74 15 98 %  04/01/21 1514 121/77 99.9 F (37.7 C) -- 77 18 97 %  04/01/21 1221 121/69 99.2 F (37.3 C) -- 70 18 100 %  lungs clear to A Cor RRR  abd: soft nondistended active BS Sl tender over uterus  pfannensteil skin incision Pad none  ext no edema or calf tenderness  CBC Latest Ref Rng & Units 04/02/2021 03/31/2021 03/30/2021  WBC 4.0 - 10.5 K/uL 6.5 16.0(H) 20.5(H)  Hemoglobin 12.0 - 15.0 g/dL 9.2(L) 10.2(L) 10.7(L)  Hematocrit 36.0 - 46.0 % 28.9(L) 31.7(L) 33.5(L)  Platelets 150 - 400 K/uL 365 284 311  IMP: pelvic pain markedly improved Leukocytosis resolved Degenerating uterine fibroid IDA hypothyroidism P) spoke with on call ID regarding possible  oral antibiotic choice in light of Flagyl IV and oral cause nausea Given cipro and clindamycin may cause C . Difficule. Disc no source of infection, poss cause of increased Wbc  such as response to stress, pain. Therefore will observe w/o antibiotics. Reviewed all the prior studies And labs as well as last surgical op note with extensive adhesions with umb hernia repair with mesh present Given pain controlled and normalization of wbc, will discharge home and have pt do temp monitoring at home( 2x/d) Pain med options disc: will d/c vicodin use. Pt has used tramadol in the past. Has not picked up motrin or zofran  Script sent from office visit. Disc mgmt of her fibroid. At this point will refer to [facility] minimal invasive surgery For her hysterectomy.  ( pt did not do well with postop pain after C/S). Disc above with pt and husband. Will start iron supplement. Advised cannot take with thyroid med.  Disc GI referral  for ongoing nausea D/c home D/c instructions reviewed. Call if temp > 100.4 d

## 2021-04-05 ENCOUNTER — Other Ambulatory Visit: Payer: Self-pay | Admitting: Obstetrics and Gynecology

## 2021-04-05 DIAGNOSIS — R109 Unspecified abdominal pain: Secondary | ICD-10-CM

## 2021-04-05 DIAGNOSIS — D259 Leiomyoma of uterus, unspecified: Secondary | ICD-10-CM

## 2021-04-05 DIAGNOSIS — R102 Pelvic and perineal pain: Secondary | ICD-10-CM

## 2021-04-07 ENCOUNTER — Other Ambulatory Visit: Payer: Self-pay

## 2021-04-07 ENCOUNTER — Ambulatory Visit
Admission: RE | Admit: 2021-04-07 | Discharge: 2021-04-07 | Disposition: A | Discharge status: Home / Self Care | Payer: BC Managed Care – PPO | Source: Ambulatory Visit | Attending: Obstetrics and Gynecology | Admitting: Obstetrics and Gynecology

## 2021-04-07 DIAGNOSIS — D259 Leiomyoma of uterus, unspecified: Secondary | ICD-10-CM

## 2021-04-07 DIAGNOSIS — R109 Unspecified abdominal pain: Secondary | ICD-10-CM

## 2021-04-07 DIAGNOSIS — R102 Pelvic and perineal pain: Secondary | ICD-10-CM

## 2021-04-07 MED ORDER — IOPAMIDOL (ISOVUE-300) INJECTION 61%
100.0000 mL | Freq: Once | INTRAVENOUS | Status: AC | PRN
  Administered 2021-04-07: 100 mL via INTRAVENOUS

## 2021-04-28 ENCOUNTER — Telehealth: Payer: Self-pay | Admitting: Hematology

## 2021-04-28 NOTE — Telephone Encounter (Signed)
I received a call from Alison Scott for severe anemia. She wanted an urgent hem consult bc the pt's hgb is at 7.5. Alison Scott has been scheduled to see Dr. Burr Medico on 5/13 at 10am. Alison Scott will give the appt date and time to the pt and fax records to our office.

## 2021-04-29 ENCOUNTER — Inpatient Hospital Stay: Payer: BC Managed Care – PPO | Attending: Hematology | Admitting: Hematology

## 2021-04-29 ENCOUNTER — Encounter: Payer: Self-pay | Admitting: Hematology

## 2021-04-29 ENCOUNTER — Other Ambulatory Visit: Payer: Self-pay

## 2021-04-29 VITALS — BP 113/58 | HR 62 | Temp 97.6°F | Resp 17 | Ht 63.0 in | Wt 164.6 lb

## 2021-04-29 DIAGNOSIS — E890 Postprocedural hypothyroidism: Secondary | ICD-10-CM | POA: Insufficient documentation

## 2021-04-29 DIAGNOSIS — Z90710 Acquired absence of both cervix and uterus: Secondary | ICD-10-CM | POA: Insufficient documentation

## 2021-04-29 DIAGNOSIS — Z1231 Encounter for screening mammogram for malignant neoplasm of breast: Secondary | ICD-10-CM

## 2021-04-29 DIAGNOSIS — N938 Other specified abnormal uterine and vaginal bleeding: Secondary | ICD-10-CM | POA: Insufficient documentation

## 2021-04-29 DIAGNOSIS — Z87891 Personal history of nicotine dependence: Secondary | ICD-10-CM | POA: Diagnosis not present

## 2021-04-29 DIAGNOSIS — D500 Iron deficiency anemia secondary to blood loss (chronic): Secondary | ICD-10-CM | POA: Diagnosis present

## 2021-04-29 DIAGNOSIS — D259 Leiomyoma of uterus, unspecified: Secondary | ICD-10-CM | POA: Diagnosis not present

## 2021-04-29 NOTE — Progress Notes (Signed)
[place]   [phone] Fax:([phone]) Anna Note   Patient Care Team: Rexford Maus as PCP - General (Internal Medicine)  Date of Service:  04/29/2021   CHIEF COMPLAINTS/PURPOSE OF CONSULTATION:  Anemia  REFERRING PHYSICIAN:  Gyn, Dr Garwin Brothers  HISTORY OF PRESENTING ILLNESS:  Alison Scott 40 y.o. female is a here because of anemia. The patient was referred by her Gyn, Dr Garwin Brothers. The patient presents to the clinic today alone  She notes she underwent hysterectomy by Dr Lanelle Bal at [facility] on 04/15/21 due to heavy bleeding and degenerative fibroids. She remains to have her ovaries. She notes positive ANR test. She notes her periods prior to surgery were very heavy with major blood clots. She required diapers and last period staring 04/08/21 and lasted over 8 days, until surgery. She notes her heavy and painful periods started 2-3 years ago. Before then, her periods were regular and manageable.  She has 3 pregnancies 3 deliveries by C-section.   She notes she had thyroid removed given her thyroid was "leaking" and elevated TSH. She is now on thyroid replacement. She notes joint pain from right side of body and right upper extremity. This pain is intermittent and almost soley of right body. This started with fibroid reaction. She plans to see RA on 05/30/21 for more work up. She notes she required D&C from hemorrhaging after Depo injection. . She had stomach hernia. I reviewed her medication list with her. Her maternal grandfather had prostate.  Socially she is married and has 3 children. 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  She quit smoking in 2017 after smoking for 20 years 1-2 ppweek. She does still drink alcohol.     REVIEW OF SYSTEMS:   Constitutional: Denies fevers, chills or abnormal night sweats Eyes: Denies blurriness of vision, double vision or watery eyes Ears, nose, mouth, throat, and face: Denies mucositis  or sore throat Respiratory: Denies cough, dyspnea or wheezes Cardiovascular: Denies palpitation, chest discomfort or lower extremity swelling Gastrointestinal:  Denies nausea, heartburn or change in bowel habits Skin: Denies abnormal skin rashes Lymphatics: Denies new lymphadenopathy or easy bruising Neurological:Denies numbness, tingling or new weaknesses Behavioral/Psych: Mood is stable, no new changes  All other systems were reviewed with the patient and are negative.   MEDICAL HISTORY:  Past Medical History:  Diagnosis Date  . Hyperthyroidism     SURGICAL HISTORY: Past Surgical History:  Procedure Laterality Date  . CESAREAN SECTION    . HERNIA REPAIR    . IR RADIOLOGIST EVAL & MGMT  11/05/2019  . THYROIDECTOMY      SOCIAL HISTORY: Social History   Socioeconomic History  . Marital status: Married    Spouse name: Not on file  . Number of children: 3  . Years of education: Not on file  . Highest education level: Not on file  Occupational History  . Not on file  Tobacco Use  . Smoking status: Former Smoker    Packs/day: 0.25    Years: 20.00    Pack years: 5.00    Quit date: 12/19/2015    Years since quitting: 5.3  . Smokeless tobacco: Former Network engineer and Sexual Activity  . Alcohol use: Yes    Comment: occasionally  . Drug use: No  . Sexual activity: Yes    Birth control/protection: None  Other Topics Concern  . Not on file  Social History Narrative   ** Merged History Encounter **  Social Determinants of Health   Financial Resource Strain: Not on file  Food Insecurity: Not on file  Transportation Needs: Not on file  Physical Activity: Not on file  Stress: Not on file  Social Connections: Not on file  Intimate Partner Violence: Not on file    FAMILY HISTORY: Family History  Problem Relation Age of Onset  . Cancer Maternal Grandfather        prostate cancer    ALLERGIES:  is allergic to latex and percocet  [oxycodone-acetaminophen].  MEDICATIONS:  Current Outpatient Medications  Medication Sig Dispense Refill  . HYDROcodone-acetaminophen (NORCO/VICODIN) 5-325 MG tablet Take 1-2 tablets by mouth every 4 (four) hours as needed. 15 tablet 0  . iron polysaccharides (NIFEREX) 150 MG capsule Take 1 capsule (150 mg total) by mouth daily. 30 capsule 11  . levothyroxine (SYNTHROID) 100 MCG tablet Take 100 mcg by mouth every morning.    . ondansetron (ZOFRAN) 4 MG tablet Take 4 mg by mouth every 8 (eight) hours as needed for nausea/vomiting.    . Vitamin D, Ergocalciferol, (DRISDOL) 1.25 MG (50000 UNIT) CAPS capsule Take 50,000 Units by mouth once a week.     No current facility-administered medications for this visit.    PHYSICAL EXAMINATION: ECOG PERFORMANCE STATUS: 1 - Symptomatic but completely ambulatory  Vitals:   04/29/21 0907  BP: (!) 113/58  Pulse: 62  Resp: 17  Temp: 97.6 F (36.4 C)  SpO2: 98%   Filed Weights   04/29/21 0907  Weight: 164 lb 9.6 oz (74.7 kg)    GENERAL:alert, no distress and comfortable SKIN: skin color, texture, turgor are normal, no rashes or significant lesions EYES: normal, Conjunctiva are pink and non-injected, sclera clear  NECK: supple, thyroid normal size, non-tender, without nodularity LYMPH:  no palpable lymphadenopathy in the cervical, axillary  LUNGS: clear to auscultation and percussion with normal breathing effort HEART: regular rate & rhythm and no murmurs and no lower extremity edema ABDOMEN:abdomen soft, non-tender and normal bowel sounds (+) Surgical incision healed well  Musculoskeletal:no cyanosis of digits and no clubbing  NEURO: alert & oriented x 3 with fluent speech, no focal motor/sensory deficits  LABORATORY DATA:  I have reviewed the data as listed CBC Latest Ref Rng & Units 04/02/2021 03/31/2021 03/30/2021  WBC 4.0 - 10.5 K/uL 6.5 16.0(H) 20.5(H)  Hemoglobin 12.0 - 15.0 g/dL 9.2(L) 10.2(L) 10.7(L)  Hematocrit 36.0 - 46.0 % 28.9(L)  31.7(L) 33.5(L)  Platelets 150 - 400 K/uL 365 284 311    CMP Latest Ref Rng & Units 03/29/2021 11/12/2019 12/01/2017  Glucose 70 - 99 mg/dL 126(H) - 93  BUN 6 - 20 mg/dL 5(L) - 7  Creatinine 0.44 - 1.00 mg/dL 0.61 0.70 0.64  Sodium 135 - 145 mmol/L 134(L) - 135  Potassium 3.5 - 5.1 mmol/L 3.5 - 3.8  Chloride 98 - 111 mmol/L 105 - 104  CO2 22 - 32 mmol/L 20(L) - 24  Calcium 8.9 - 10.3 mg/dL 8.7(L) - 8.6(L)  Total Protein 6.5 - 8.1 g/dL 7.7 - 8.0  Total Bilirubin 0.3 - 1.2 mg/dL 0.6 - 0.5  Alkaline Phos 38 - 126 U/L 49 - 69  AST 15 - 41 U/L 39 - 23  ALT 0 - 44 U/L 17 - 16   OUTSIDE LABS:   04/06/21 HCT 29.8 Hg 9.8 MCH 26.6 MCHC 32.9 MCV 80.8 PLT 752k   04/19/21  HCT 23.3 HG 7.7 MCH 25.7 MCHC 33 MCV 77.7 PLT 714k   04/22/21 HCT 24.5 HG 7.8  MCH 25.1 MCHC 31.9 MCV 778.5 PLT 715k   04/27/21 HCT 23.2 HG 7.4 MCH 24.4 MCHC 31.9 MCV 76.6 PLT 564k   RADIOGRAPHIC STUDIES: I have personally reviewed the radiological images as listed and agreed with the findings in the report. CT ABDOMEN PELVIS W CONTRAST  Result Date: 04/08/2021 CLINICAL DATA:  Pelvic pain, abdominal pain. Nausea and vomiting. Chills. Heavy vaginal bleeding. EXAM: CT ABDOMEN AND PELVIS WITH CONTRAST TECHNIQUE: Multidetector CT imaging of the abdomen and pelvis was performed using the standard protocol following bolus administration of intravenous contrast. CONTRAST:  149mL ISOVUE-300 IOPAMIDOL (ISOVUE-300) INJECTION 61% COMPARISON:  03/29/2021 and 12/01/2017. MR pelvis 11/12/2019 and pelvic ultrasound 03/29/2021. FINDINGS: Lower chest: Lung bases are clear. Heart size normal. No pericardial or pleural effusion. Distal esophagus is unremarkable. Hepatobiliary: Liver and gallbladder are unremarkable. No biliary ductal dilatation. Pancreas: Negative. Spleen: Negative. Adrenals/Urinary Tract: Right adrenal gland is unremarkable. 1.7 cm left adrenal nodule measures 36 Hounsfield units on early portal venous phase  imaging and 42 Hounsfield units on nephrographic phase imaging. Kidneys are unremarkable. Ureters are decompressed. Bladder is grossly unremarkable. Stomach/Bowel: Stomach, small bowel, appendix and colon are unremarkable. Vascular/Lymphatic: Vascular structures are unremarkable. No pathologically enlarged lymph nodes. Left common iliac chain lymph nodes measure up to 9 mm. Reproductive: Large complex low-attenuation mass in the uterus measures 8.0 x 9.7 cm, similar to MR pelvis 11/12/2019 and enlarged from 4.5 x 6.2 cm on 12/01/2017. No adnexal mass. Other: No free fluid.  Mesenteries and peritoneum are unremarkable. Musculoskeletal: None. IMPRESSION: 1. No acute findings. 2. Large complex uterine mass, characterized as a fibroid on comparison imaging. 3. Left adrenal nodule cannot be characterized as an adenoma. If further evaluation is desired, MR abdomen without and with contrast is recommended. Electronically Signed   By: Lorin Picket M.D.   On: 04/08/2021 11:21    ASSESSMENT & PLAN:  Colton Engdahl is a 40 y.o. African American female with a history of   1. Anemia from bleeding and iron deficiency -Due to heavy menstrual bleeding from degenerative fibroids, she underwent hysterectomy by Dr Lanelle Bal at [facility] on 04/15/21. She remains to have her ovaries. Prior to surgery she started having increasingly heavy menses in the past 2-3 years.  -Her recent labs with Gyn have showed Hg has been lower (7.4) and platelet elevated (778K), low saturation, high TIBC, consistent with iron deficiency, which is likely the cause of her anemia. -Her elevated platelet is likely reactive to iron deficiency. -She has been on oral iron TID for the past week. She has not noticed any improvement in her SOB and fatigue. I discussed IV iron for more direct correction. I reviewed side effects with her including possible severe allergy reaction and iron overload. She agreed to proceed.  -IV Venofer 200 mg twice next week  and once with 400mg  in 2 weeks. If she is still anemic after infusions, will do further workup for another cause of anemia.  -repeat lab in 2 and 4 weeks -f/u in 3 months.   2. Right side body pain  -She notes having intermittent right side body pain. She plans to do more work up with Rheumatologist in June.  -I do not suspect this is related to her anemia.    3. S/p thyroidectomy  -On thyroid replacement    4. Age appropriate cancer screenings  -I recommend she start yearly mammograms given she is 20. She is agreeable.    PLAN:  -IV Venofer 200mg  twice weekly next week  -  IV Venofer 400mg  once in 2 weeks -lab in 2 and 4 weeks  -Lab and f/u in 3 months  -Mammogram in 1-2 months    Orders Placed This Encounter  Procedures  . MM Digital Screening    Standing Status:   Future    Standing Expiration Date:   04/30/2022    Order Specific Question:   Reason for Exam (SYMPTOM  OR DIAGNOSIS REQUIRED)    Answer:   screening    Order Specific Question:   Is the patient pregnant?    Answer:   No    Order Specific Question:   Preferred imaging location?    Answer:   [facility]    All questions were answered. The patient knows to call the clinic with any problems, questions or concerns. The total time spent in the appointment was 30 minutes.     Truitt Merle, MD 04/29/2021 10:21 AM  I, Joslyn [place], am acting as scribe for Truitt Merle, MD.   I have reviewed the above documentation for accuracy and completeness, and I agree with the above.

## 2021-04-30 ENCOUNTER — Encounter: Payer: Self-pay | Admitting: Hematology

## 2021-05-02 ENCOUNTER — Telehealth: Payer: Self-pay | Admitting: Hematology

## 2021-05-02 ENCOUNTER — Other Ambulatory Visit: Payer: Self-pay | Admitting: Hematology

## 2021-05-02 NOTE — Telephone Encounter (Signed)
Scheduled follow-up appointments per 5/13 los. Patient is aware. 

## 2021-05-04 ENCOUNTER — Other Ambulatory Visit: Payer: Self-pay

## 2021-05-04 ENCOUNTER — Inpatient Hospital Stay: Payer: BC Managed Care – PPO

## 2021-05-04 VITALS — BP 101/68 | HR 81 | Temp 98.3°F | Resp 18

## 2021-05-04 DIAGNOSIS — D500 Iron deficiency anemia secondary to blood loss (chronic): Secondary | ICD-10-CM

## 2021-05-04 MED ORDER — DIPHENHYDRAMINE HCL 25 MG PO CAPS
25.0000 mg | ORAL_CAPSULE | Freq: Once | ORAL | Status: AC
Start: 2021-05-04 — End: 2021-05-04
  Administered 2021-05-04: 25 mg via ORAL
  Filled 2021-05-04: qty 1

## 2021-05-04 MED ORDER — SODIUM CHLORIDE 0.9 % IV SOLN
Freq: Once | INTRAVENOUS | Status: AC
Start: 2021-05-04 — End: 2021-05-04
  Filled 2021-05-04: qty 250

## 2021-05-04 MED ORDER — SODIUM CHLORIDE 0.9 % IV SOLN
200.0000 mg | Freq: Once | INTRAVENOUS | Status: AC
  Administered 2021-05-04: 200 mg via INTRAVENOUS
  Filled 2021-05-04: qty 10

## 2021-05-04 NOTE — Patient Instructions (Signed)
[facility]  Discharge Instructions: Thank you for choosing [facility] to provide your oncology and hematology care.   If you have a lab appointment with the [facility], please go directly to the [facility] and check in at the registration area.   Wear comfortable clothing and clothing appropriate for easy access to any Portacath or PICC line.   We strive to give you quality time with your provider. You may need to reschedule your appointment if you arrive late (15 or more minutes).  Arriving late affects you and other patients whose appointments are after yours.  Also, if you miss three or more appointments without notifying the office, you may be dismissed from the clinic at the provider's discretion.      For prescription refill requests, have your pharmacy contact our office and allow 72 hours for refills to be completed.    Today you received the following VENOFER  To help prevent nausea and vomiting after your treatment, we encourage you to take your nausea medication as directed.  BELOW ARE SYMPTOMS THAT SHOULD BE REPORTED IMMEDIATELY: . *FEVER GREATER THAN 100.4 F (38 C) OR HIGHER . *CHILLS OR SWEATING . *NAUSEA AND VOMITING THAT IS NOT CONTROLLED WITH YOUR NAUSEA MEDICATION . *UNUSUAL SHORTNESS OF BREATH . *UNUSUAL BRUISING OR BLEEDING . *URINARY PROBLEMS (pain or burning when urinating, or frequent urination) . *BOWEL PROBLEMS (unusual diarrhea, constipation, pain near the anus) . TENDERNESS IN MOUTH AND THROAT WITH OR WITHOUT PRESENCE OF ULCERS (sore throat, sores in mouth, or a toothache) . UNUSUAL RASH, SWELLING OR PAIN  . UNUSUAL VAGINAL DISCHARGE OR ITCHING   Items with * indicate a potential emergency and should be followed up as soon as possible or go to the Emergency Department if any problems should occur.  Please show the CHEMOTHERAPY ALERT CARD or IMMUNOTHERAPY ALERT CARD at check-in to the Emergency Department and  triage nurse.  Should you have questions after your visit or need to cancel or reschedule your appointment, please contact [facility]  Dept: [phone]  and follow the prompts.  Office hours are 8:00 a.m. to 4:30 p.m. Monday - Friday. Please note that voicemails left after 4:00 p.m. may not be returned until the following business day.  We are closed weekends and major holidays. You have access to a nurse at all times for urgent questions. Please call the main number to the clinic Dept: [phone] and follow the prompts.   For any non-urgent questions, you may also contact your provider using MyChart. We now offer e-Visits for anyone 39 and older to request care online for non-urgent symptoms. For details visit [email or web address].   Also download the MyChart app! Go to the app store, search "MyChart", open the app, select [facility], and log in with your MyChart username and password.  Due to Covid, a mask is required upon entering the hospital/clinic. If you do not have a mask, one will be given to you upon arrival. For doctor visits, patients may have 1 support person aged 75 or older with them. For treatment visits, patients cannot have anyone with them due to current Covid guidelines and our immunocompromised population.   Iron Sucrose injection What is this medicine? IRON SUCROSE (AHY ern SOO krohs) is an iron complex. Iron is used to make healthy red blood cells, which carry oxygen and nutrients throughout the body. This medicine is used to treat iron deficiency anemia in people with chronic  kidney disease. This medicine may be used for other purposes; ask your health care provider or pharmacist if you have questions. COMMON BRAND NAME(S): Venofer What should I tell my health care provider before I take this medicine? They need to know if you have any of these conditions:  anemia not caused by low iron levels  heart disease  high levels of iron in  the blood  kidney disease  liver disease  an unusual or allergic reaction to iron, other medicines, foods, dyes, or preservatives  pregnant or trying to get pregnant  breast-feeding How should I use this medicine? This medicine is for infusion into a vein. It is given by a health care professional in a hospital or clinic setting. Talk to your pediatrician regarding the use of this medicine in children. While this drug may be prescribed for children as young as 2 years for selected conditions, precautions do apply. Overdosage: If you think you have taken too much of this medicine contact a poison control center or emergency room at once. NOTE: This medicine is only for you. Do not share this medicine with others. What if I miss a dose? It is important not to miss your dose. Call your doctor or health care professional if you are unable to keep an appointment. What may interact with this medicine? Do not take this medicine with any of the following medications:  deferoxamine  dimercaprol  other iron products This medicine may also interact with the following medications:  chloramphenicol  deferasirox This list may not describe all possible interactions. Give your health care provider a list of all the medicines, herbs, non-prescription drugs, or dietary supplements you use. Also tell them if you smoke, drink alcohol, or use illegal drugs. Some items may interact with your medicine. What should I watch for while using this medicine? Visit your doctor or healthcare professional regularly. Tell your doctor or healthcare professional if your symptoms do not start to get better or if they get worse. You may need blood work done while you are taking this medicine. You may need to follow a special diet. Talk to your doctor. Foods that contain iron include: whole grains/cereals, dried fruits, beans, or peas, leafy green vegetables, and organ meats (liver, kidney). What side effects may I  notice from receiving this medicine? Side effects that you should report to your doctor or health care professional as soon as possible:  allergic reactions like skin rash, itching or hives, swelling of the face, lips, or tongue  breathing problems  changes in blood pressure  cough  fast, irregular heartbeat  feeling faint or lightheaded, falls  fever or chills  flushing, sweating, or hot feelings  joint or muscle aches/pains  seizures  swelling of the ankles or feet  unusually weak or tired Side effects that usually do not require medical attention (report to your doctor or health care professional if they continue or are bothersome):  diarrhea  feeling achy  headache  irritation at site where injected  nausea, vomiting  stomach upset  tiredness This list may not describe all possible side effects. Call your doctor for medical advice about side effects. You may report side effects to FDA at 1-800-FDA-1088. Where should I keep my medicine? This drug is given in a hospital or clinic and will not be stored at home. NOTE: This sheet is a summary. It may not cover all possible information. If you have questions about this medicine, talk to your doctor, pharmacist, or health care  provider.  2021 Elsevier/Gold Standard (2011-09-14 17:14:35)

## 2021-05-06 ENCOUNTER — Other Ambulatory Visit: Payer: Self-pay

## 2021-05-06 ENCOUNTER — Inpatient Hospital Stay: Payer: BC Managed Care – PPO

## 2021-05-06 VITALS — BP 104/64 | HR 69 | Temp 98.9°F | Resp 18

## 2021-05-06 DIAGNOSIS — D500 Iron deficiency anemia secondary to blood loss (chronic): Secondary | ICD-10-CM | POA: Diagnosis not present

## 2021-05-06 MED ORDER — DIPHENHYDRAMINE HCL 25 MG PO CAPS
25.0000 mg | ORAL_CAPSULE | Freq: Once | ORAL | Status: AC
  Administered 2021-05-06: 25 mg via ORAL
  Filled 2021-05-06: qty 1

## 2021-05-06 MED ORDER — SODIUM CHLORIDE 0.9 % IV SOLN
Freq: Once | INTRAVENOUS | Status: AC
  Filled 2021-05-06: qty 250

## 2021-05-06 MED ORDER — SODIUM CHLORIDE 0.9 % IV SOLN
200.0000 mg | Freq: Once | INTRAVENOUS | Status: AC
  Administered 2021-05-06: 200 mg via INTRAVENOUS
  Filled 2021-05-06: qty 10

## 2021-05-06 NOTE — Patient Instructions (Signed)

## 2021-05-13 ENCOUNTER — Other Ambulatory Visit: Payer: Self-pay

## 2021-05-13 ENCOUNTER — Inpatient Hospital Stay: Payer: BC Managed Care – PPO

## 2021-05-13 ENCOUNTER — Other Ambulatory Visit: Payer: BC Managed Care – PPO

## 2021-05-13 ENCOUNTER — Ambulatory Visit: Payer: BC Managed Care – PPO

## 2021-05-13 VITALS — BP 112/67 | HR 64 | Temp 98.3°F | Resp 18

## 2021-05-13 DIAGNOSIS — D500 Iron deficiency anemia secondary to blood loss (chronic): Secondary | ICD-10-CM

## 2021-05-13 LAB — CBC WITH DIFFERENTIAL (CANCER CENTER ONLY)
Abs Immature Granulocytes: 0.03 10*3/uL (ref 0.00–0.07)
Basophils Absolute: 0 10*3/uL (ref 0.0–0.1)
Basophils Relative: 0 %
Eosinophils Absolute: 0.4 10*3/uL (ref 0.0–0.5)
Eosinophils Relative: 7 %
HCT: 31.8 % — ABNORMAL LOW (ref 36.0–46.0)
Hemoglobin: 9.7 g/dL — ABNORMAL LOW (ref 12.0–15.0)
Immature Granulocytes: 1 %
Lymphocytes Relative: 39 %
Lymphs Abs: 2.1 10*3/uL (ref 0.7–4.0)
MCH: 25.5 pg — ABNORMAL LOW (ref 26.0–34.0)
MCHC: 30.5 g/dL (ref 30.0–36.0)
MCV: 83.5 fL (ref 80.0–100.0)
Monocytes Absolute: 0.6 10*3/uL (ref 0.1–1.0)
Monocytes Relative: 10 %
Neutro Abs: 2.4 10*3/uL (ref 1.7–7.7)
Neutrophils Relative %: 43 %
Platelet Count: 437 10*3/uL — ABNORMAL HIGH (ref 150–400)
RBC: 3.81 MIL/uL — ABNORMAL LOW (ref 3.87–5.11)
RDW: 21.5 % — ABNORMAL HIGH (ref 11.5–15.5)
WBC Count: 5.5 10*3/uL (ref 4.0–10.5)
nRBC: 0 % (ref 0.0–0.2)

## 2021-05-13 LAB — FERRITIN: Ferritin: 107 ng/mL (ref 11–307)

## 2021-05-13 LAB — IRON AND TIBC
Iron: 32 ug/dL — ABNORMAL LOW (ref 41–142)
Saturation Ratios: 7 % — ABNORMAL LOW (ref 21–57)
TIBC: 436 ug/dL (ref 236–444)
UIBC: 404 ug/dL — ABNORMAL HIGH (ref 120–384)

## 2021-05-13 MED ORDER — SODIUM CHLORIDE 0.9 % IV SOLN
Freq: Once | INTRAVENOUS | Status: AC
  Filled 2021-05-13: qty 250

## 2021-05-13 MED ORDER — SODIUM CHLORIDE 0.9 % IV SOLN
400.0000 mg | Freq: Once | INTRAVENOUS | Status: AC
  Administered 2021-05-13: 400 mg via INTRAVENOUS
  Filled 2021-05-13: qty 20

## 2021-05-13 MED ORDER — DIPHENHYDRAMINE HCL 25 MG PO CAPS
ORAL_CAPSULE | ORAL | Status: AC
  Filled 2021-05-13: qty 1

## 2021-05-13 MED ORDER — DIPHENHYDRAMINE HCL 25 MG PO CAPS
25.0000 mg | ORAL_CAPSULE | Freq: Once | ORAL | Status: AC
  Administered 2021-05-13: 25 mg via ORAL

## 2021-05-13 NOTE — Patient Instructions (Signed)

## 2021-05-27 ENCOUNTER — Other Ambulatory Visit: Payer: Self-pay

## 2021-05-27 ENCOUNTER — Inpatient Hospital Stay: Payer: BC Managed Care – PPO | Attending: Hematology

## 2021-05-27 DIAGNOSIS — N920 Excessive and frequent menstruation with regular cycle: Secondary | ICD-10-CM | POA: Diagnosis not present

## 2021-05-27 DIAGNOSIS — D500 Iron deficiency anemia secondary to blood loss (chronic): Secondary | ICD-10-CM | POA: Insufficient documentation

## 2021-05-27 LAB — CBC WITH DIFFERENTIAL (CANCER CENTER ONLY)
Abs Immature Granulocytes: 0.02 10*3/uL (ref 0.00–0.07)
Basophils Absolute: 0 10*3/uL (ref 0.0–0.1)
Basophils Relative: 1 %
Eosinophils Absolute: 0.3 10*3/uL (ref 0.0–0.5)
Eosinophils Relative: 4 %
HCT: 33.5 % — ABNORMAL LOW (ref 36.0–46.0)
Hemoglobin: 10.5 g/dL — ABNORMAL LOW (ref 12.0–15.0)
Immature Granulocytes: 0 %
Lymphocytes Relative: 33 %
Lymphs Abs: 2 10*3/uL (ref 0.7–4.0)
MCH: 26.5 pg (ref 26.0–34.0)
MCHC: 31.3 g/dL (ref 30.0–36.0)
MCV: 84.6 fL (ref 80.0–100.0)
Monocytes Absolute: 0.5 10*3/uL (ref 0.1–1.0)
Monocytes Relative: 8 %
Neutro Abs: 3.2 10*3/uL (ref 1.7–7.7)
Neutrophils Relative %: 54 %
Platelet Count: 350 10*3/uL (ref 150–400)
RBC: 3.96 MIL/uL (ref 3.87–5.11)
RDW: 22.3 % — ABNORMAL HIGH (ref 11.5–15.5)
WBC Count: 6 10*3/uL (ref 4.0–10.5)
nRBC: 0 % (ref 0.0–0.2)

## 2021-07-11 ENCOUNTER — Ambulatory Visit: Payer: BC Managed Care – PPO

## 2021-08-01 ENCOUNTER — Inpatient Hospital Stay: Payer: BC Managed Care – PPO | Attending: Hematology | Admitting: Hematology

## 2021-08-01 ENCOUNTER — Ambulatory Visit (HOSPITAL_COMMUNITY)
Admission: RE | Admit: 2021-08-01 | Discharge: 2021-08-01 | Disposition: A | Discharge status: Home / Self Care | Payer: BC Managed Care – PPO | Source: Ambulatory Visit | Attending: Hematology | Admitting: Hematology

## 2021-08-01 ENCOUNTER — Other Ambulatory Visit: Payer: Self-pay

## 2021-08-01 ENCOUNTER — Encounter: Payer: Self-pay | Admitting: Hematology

## 2021-08-01 ENCOUNTER — Inpatient Hospital Stay: Payer: BC Managed Care – PPO

## 2021-08-01 VITALS — BP 118/97 | HR 67 | Temp 98.7°F | Resp 20 | Ht 63.0 in | Wt 174.2 lb

## 2021-08-01 DIAGNOSIS — N920 Excessive and frequent menstruation with regular cycle: Secondary | ICD-10-CM | POA: Diagnosis not present

## 2021-08-01 DIAGNOSIS — M79671 Pain in right foot: Secondary | ICD-10-CM

## 2021-08-01 DIAGNOSIS — D500 Iron deficiency anemia secondary to blood loss (chronic): Secondary | ICD-10-CM | POA: Diagnosis not present

## 2021-08-01 DIAGNOSIS — Z800 Family history of malignant neoplasm of digestive organs: Secondary | ICD-10-CM | POA: Insufficient documentation

## 2021-08-01 LAB — CBC WITH DIFFERENTIAL (CANCER CENTER ONLY)
Abs Immature Granulocytes: 0.02 10*3/uL (ref 0.00–0.07)
Basophils Absolute: 0.1 10*3/uL (ref 0.0–0.1)
Basophils Relative: 1 %
Eosinophils Absolute: 0.2 10*3/uL (ref 0.0–0.5)
Eosinophils Relative: 2 %
HCT: 38.2 % (ref 36.0–46.0)
Hemoglobin: 12.3 g/dL (ref 12.0–15.0)
Immature Granulocytes: 0 %
Lymphocytes Relative: 37 %
Lymphs Abs: 2.5 10*3/uL (ref 0.7–4.0)
MCH: 28.1 pg (ref 26.0–34.0)
MCHC: 32.2 g/dL (ref 30.0–36.0)
MCV: 87.2 fL (ref 80.0–100.0)
Monocytes Absolute: 0.6 10*3/uL (ref 0.1–1.0)
Monocytes Relative: 8 %
Neutro Abs: 3.6 10*3/uL (ref 1.7–7.7)
Neutrophils Relative %: 52 %
Platelet Count: 293 10*3/uL (ref 150–400)
RBC: 4.38 MIL/uL (ref 3.87–5.11)
RDW: 18.6 % — ABNORMAL HIGH (ref 11.5–15.5)
WBC Count: 6.9 10*3/uL (ref 4.0–10.5)
nRBC: 0 % (ref 0.0–0.2)

## 2021-08-01 LAB — IRON AND TIBC
Iron: 103 ug/dL (ref 41–142)
Saturation Ratios: 23 % (ref 21–57)
TIBC: 440 ug/dL (ref 236–444)
UIBC: 337 ug/dL (ref 120–384)

## 2021-08-01 LAB — FERRITIN: Ferritin: 16 ng/mL (ref 11–307)

## 2021-08-01 NOTE — Progress Notes (Signed)
[name]   [phone] [phone]) [phone]   Clinic Follow up Note   Patient Care Team: Kathreen Devoid, PA-C as PCP - General (Internal Medicine)  Date of Service:  08/01/2021  CHIEF COMPLAINT: f/u of anemia  CURRENT THERAPY:  IV Venofer as needed.  INTERVAL HISTORY:  Alison Scott is here for a follow up of anemia. She was last seen by me on 04/30/21 in consultation. She presents to the clinic accompanied by her husband. She reports she felt better following the IV iron but is starting to feel badly again. Her husband notes she injured her big toe on Saturday night. I will refer her for x-ray.   All other systems were reviewed with the patient and are negative.  MEDICAL HISTORY:  Past Medical History:  Diagnosis Date   Hyperthyroidism     SURGICAL HISTORY: Past Surgical History:  Procedure Laterality Date   CESAREAN SECTION     HERNIA REPAIR     IR RADIOLOGIST EVAL & MGMT  11/05/2019   THYROIDECTOMY      I have reviewed the social history and family history with the patient and they are unchanged from previous note.  ALLERGIES:  is allergic to latex and percocet [oxycodone-acetaminophen].  MEDICATIONS:  Current Outpatient Medications  Medication Sig Dispense Refill   HYDROcodone-acetaminophen (NORCO/VICODIN) 5-325 MG tablet Take 1-2 tablets by mouth every 4 (four) hours as needed. 15 tablet 0   iron polysaccharides (NIFEREX) 150 MG capsule Take 1 capsule (150 mg total) by mouth daily. 30 capsule 11   levothyroxine (SYNTHROID) 100 MCG tablet Take 100 mcg by mouth every morning.     ondansetron (ZOFRAN) 4 MG tablet Take 4 mg by mouth every 8 (eight) hours as needed for nausea/vomiting.     Vitamin D, Ergocalciferol, (DRISDOL) 1.25 MG (50000 UNIT) CAPS capsule Take 50,000 Units by mouth once a week.     No current facility-administered medications for this visit.    PHYSICAL EXAMINATION: ECOG PERFORMANCE STATUS: 0 -  Asymptomatic  Vitals:   08/01/21 1212 08/01/21 1213  BP: (!) 118/97 (!) 118/97  Pulse: 67 67  Resp: 20 20  Temp: 98.7 F (37.1 C) 98.7 F (37.1 C)   Wt Readings from Last 3 Encounters:  08/01/21 174 lb 3.2 oz (79 kg)  04/29/21 164 lb 9.6 oz (74.7 kg)  03/30/21 170 lb (77.1 kg)     GENERAL:alert, no distress and comfortable SKIN: skin color normal, no rashes or significant lesions EYES: normal, Conjunctiva are pink and non-injected, sclera clear  NEURO: alert & oriented x 3 with fluent speech  LABORATORY DATA:  I have reviewed the data as listed CBC Latest Ref Rng & Units 08/01/2021 05/27/2021 05/13/2021  WBC 4.0 - 10.5 K/uL 6.9 6.0 5.5  Hemoglobin 12.0 - 15.0 g/dL 12.3 10.5(L) 9.7(L)  Hematocrit 36.0 - 46.0 % 38.2 33.5(L) 31.8(L)  Platelets 150 - 400 K/uL 293 350 437(H)     CMP Latest Ref Rng & Units 03/29/2021 11/12/2019 12/01/2017  Glucose 70 - 99 mg/dL 126(H) - 93  BUN 6 - 20 mg/dL 5(L) - 7  Creatinine 0.44 - 1.00 mg/dL 0.61 0.70 0.64  Sodium 135 - 145 mmol/L 134(L) - 135  Potassium 3.5 - 5.1 mmol/L 3.5 - 3.8  Chloride 98 - 111 mmol/L 105 - 104  CO2 22 - 32 mmol/L 20(L) - 24  Calcium 8.9 - 10.3 mg/dL 8.7(L) - 8.6(L)  Total Protein 6.5 - 8.1 g/dL 7.7 - 8.0  Total  Bilirubin 0.3 - 1.2 mg/dL 0.6 - 0.5  Alkaline Phos 38 - 126 U/L 49 - 69  AST 15 - 41 U/L 39 - 23  ALT 0 - 44 U/L 17 - 16      RADIOGRAPHIC STUDIES: I have personally reviewed the radiological images as listed and agreed with the findings in the report. DG Foot Complete Right  Result Date: 08/01/2021 CLINICAL DATA:  Fall EXAM: RIGHT FOOT COMPLETE - 3+ VIEW COMPARISON:  None. FINDINGS: There is no evidence of fracture or dislocation. There is no evidence of arthropathy or other focal bone abnormality. Soft tissues are unremarkable. IMPRESSION: Negative. Electronically Signed   By: Yetta Glassman M.D.   On: 08/01/2021 13:20     ASSESSMENT & PLAN:  Alison Scott is a 40 y.o. female with   1.  Anemia from bleeding and iron deficiency -Due to increasingly heavy menstrual bleeding from degenerative fibroids, she underwent hysterectomy by Dr Lanelle Bal at [facility] on 04/15/21. She remains to have her ovaries.   -04/27/21 labs with Gyn showed Hg has been lower (7.4) and platelet elevated (778K), low saturation, high TIBC, consistent with iron deficiency, which is likely the cause of her anemia. -she notes a family history of anemia on her father's side. -Her elevated platelet is likely reactive to iron deficiency. -She received IV Venofer, three doses, in 04/2021. She tolerated well. -Labs reviewed, CBC shows improvement, iron panel is pending. I will call her with the results. She is also in agreement to sign up for MyChart. -If iron level is normal, labs and f/u in 3 months. If iron remains low, we will plan for closer follow up (1-2 months) and possible repeat IV iron.   2. Fall and right foot pain  -she has tenderness and mild edema of right big toe after a fall two days ago -will obtain x-ray to rule out fracture    3. S/p thyroidectomy  -On thyroid replacement    4. Age appropriate cancer screenings  -I recommend she start yearly mammograms given she is 3. She is agreeable.  -history of colon cancer in her grandfather. She reports normal colonoscopy 2022, recall in 10 years.     PLAN:  -proceed to right foot x-ray to rule out big toe fracture -I will contact them with the results of today's iron panel -iv venofer if ferritin<20 -lab every 2 month and f/u in 4 months     No problem-specific Assessment & Plan notes found for this encounter.   Orders Placed This Encounter  Procedures   DG Foot Complete Right    Standing Status:   Future    Number of Occurrences:   1    Standing Expiration Date:   08/01/2022    Order Specific Question:   Reason for Exam (SYMPTOM  OR DIAGNOSIS REQUIRED)    Answer:   injury to lef foot after fall on 07/30/2021, pain and edema at big toe, rule out  fracture    Order Specific Question:   Is patient pregnant?    Answer:   No    Order Specific Question:   Preferred imaging location?    Answer:   [facility]   All questions were answered. The patient knows to call the clinic with any problems, questions or concerns. No barriers to learning was detected. The total time spent in the appointment was 25 minutes.     Truitt Merle, MD 08/01/2021   I, Wilburn Mylar, am acting as scribe for [employer]  Burr Medico, MD.   I have reviewed the above documentation for accuracy and completeness, and I agree with the above.

## 2021-08-02 ENCOUNTER — Telehealth: Payer: Self-pay

## 2021-08-02 NOTE — Telephone Encounter (Signed)
This nurse spoke with patients spouse and made aware of lab results and recommendations.  Patient request inustion to be scheduled for a Monday because those are her days off.  This nurse advised request will be forwarded to and that scheduling will reach out to her to schedule infusion.  No further questions or concerns at this time.

## 2021-08-02 NOTE — Telephone Encounter (Signed)
-----   Message from Truitt Merle, MD sent at 08/01/2021 10:54 PM EDT ----- Lajoyce Corners,  I did not send LOS for her today, but she was scheduled for lab and f/u in 2 month per today's LOS, not sure what happened. Could you keep lab in 2 months, and schedule lab and f/u in 4 months?  Rayshawn Visconti, please let pt know her iron was slightly low, and schedule iv Venofer '400mg'$  once next week, and also let her know x-ray of her foot was negative for fracture  Thanks  Krista Blue

## 2021-08-03 ENCOUNTER — Telehealth: Payer: Self-pay | Admitting: Hematology

## 2021-08-03 NOTE — Telephone Encounter (Signed)
Scheduled follow-up appointments per 8/15 staff message. Patient's husband is aware.

## 2021-08-08 ENCOUNTER — Inpatient Hospital Stay: Payer: BC Managed Care – PPO

## 2021-08-08 ENCOUNTER — Other Ambulatory Visit: Payer: Self-pay

## 2021-08-08 VITALS — BP 113/82 | HR 60 | Temp 98.4°F | Resp 18

## 2021-08-08 DIAGNOSIS — D500 Iron deficiency anemia secondary to blood loss (chronic): Secondary | ICD-10-CM

## 2021-08-08 MED ORDER — HEPARIN SOD (PORK) LOCK FLUSH 100 UNIT/ML IV SOLN: 500.0000 [IU] | Freq: Once | INTRAVENOUS | Status: DC

## 2021-08-08 MED ORDER — DIPHENHYDRAMINE HCL 25 MG PO CAPS
25.0000 mg | ORAL_CAPSULE | Freq: Once | ORAL | Status: AC
  Administered 2021-08-08: 25 mg via ORAL
  Filled 2021-08-08: qty 1

## 2021-08-08 MED ORDER — SODIUM CHLORIDE 0.9 % IV SOLN
400.0000 mg | Freq: Once | INTRAVENOUS | Status: AC
  Administered 2021-08-08: 400 mg via INTRAVENOUS
  Filled 2021-08-08: qty 20

## 2021-08-08 MED ORDER — SODIUM CHLORIDE 0.9% FLUSH: 10.0000 mL | Freq: Once | INTRAVENOUS | Status: DC

## 2021-08-08 MED ORDER — SODIUM CHLORIDE 0.9 % IV SOLN: Freq: Once | INTRAVENOUS | Status: AC

## 2021-08-08 NOTE — Patient Instructions (Signed)

## 2021-09-26 ENCOUNTER — Other Ambulatory Visit: Payer: Self-pay

## 2021-09-26 ENCOUNTER — Inpatient Hospital Stay: Payer: BC Managed Care – PPO | Attending: Hematology

## 2021-09-26 ENCOUNTER — Ambulatory Visit: Payer: BC Managed Care – PPO | Admitting: Hematology

## 2021-09-26 DIAGNOSIS — D500 Iron deficiency anemia secondary to blood loss (chronic): Secondary | ICD-10-CM

## 2021-09-26 DIAGNOSIS — D509 Iron deficiency anemia, unspecified: Secondary | ICD-10-CM | POA: Insufficient documentation

## 2021-09-26 LAB — CBC WITH DIFFERENTIAL (CANCER CENTER ONLY)
Abs Immature Granulocytes: 0.02 10*3/uL (ref 0.00–0.07)
Basophils Absolute: 0 10*3/uL (ref 0.0–0.1)
Basophils Relative: 1 %
Eosinophils Absolute: 0.2 10*3/uL (ref 0.0–0.5)
Eosinophils Relative: 3 %
HCT: 36.6 % (ref 36.0–46.0)
Hemoglobin: 12.3 g/dL (ref 12.0–15.0)
Immature Granulocytes: 0 %
Lymphocytes Relative: 39 %
Lymphs Abs: 2.6 10*3/uL (ref 0.7–4.0)
MCH: 30.5 pg (ref 26.0–34.0)
MCHC: 33.6 g/dL (ref 30.0–36.0)
MCV: 90.8 fL (ref 80.0–100.0)
Monocytes Absolute: 0.7 10*3/uL (ref 0.1–1.0)
Monocytes Relative: 11 %
Neutro Abs: 3.1 10*3/uL (ref 1.7–7.7)
Neutrophils Relative %: 46 %
Platelet Count: 265 10*3/uL (ref 150–400)
RBC: 4.03 MIL/uL (ref 3.87–5.11)
RDW: 15.2 % (ref 11.5–15.5)
WBC Count: 6.6 10*3/uL (ref 4.0–10.5)
nRBC: 0 % (ref 0.0–0.2)

## 2021-09-26 LAB — IRON AND TIBC
Iron: 77 ug/dL (ref 41–142)
Saturation Ratios: 22 % (ref 21–57)
TIBC: 356 ug/dL (ref 236–444)
UIBC: 280 ug/dL (ref 120–384)

## 2021-09-26 LAB — FERRITIN: Ferritin: 75 ng/mL (ref 11–307)

## 2021-10-31 ENCOUNTER — Encounter: Payer: Self-pay | Admitting: Emergency Medicine

## 2021-10-31 ENCOUNTER — Ambulatory Visit
Admission: EM | Admit: 2021-10-31 | Discharge: 2021-10-31 | Disposition: A | Discharge status: Home / Self Care | Payer: BC Managed Care – PPO

## 2021-10-31 DIAGNOSIS — K625 Hemorrhage of anus and rectum: Secondary | ICD-10-CM | POA: Diagnosis not present

## 2021-10-31 NOTE — ED Notes (Signed)
Marin Roberts PA came in to see patient. No obvious external hemorrhoid or abscess visualized. Patient also describes having had very narrow stools recently. The provider gave the patient their options of either being seen in the emergency department or following up with gastroenterology. Patient verbalized understanding

## 2021-10-31 NOTE — Discharge Instructions (Addendum)
ED via personal vehicle

## 2021-10-31 NOTE — ED Triage Notes (Addendum)
Recently started having rectal bleeding x 1.5 weeks. States she is currently struggling with an autoimmune disorder that they're unsure the cause of. Reproducable rectal pain. States it looks like "if you were on your menstrual cycle, that is what appears in the toilet." Not saturating any pads.

## 2021-10-31 NOTE — ED Provider Notes (Signed)
[name]    CSN: [id number] Arrival date & time: 10/31/21  1419      History   Chief Complaint Chief Complaint  Patient presents with   Rectal Bleeding    HPI Romonia Yanik is a 40 y.o. female presenting with rectal bleeding.  Medical history pelvic pain, uterine fibroid, hypothyroid, iron deficiency anemia.  Describes about 1 week of bright red blood in her toilet after having a bowel movement.  States that she is constipated at baseline, and is only having a bowel movement every 3 days which is normal for her.  States her bowel movements are a little more narrow than normal.  There is no blood in her underwear, but when she has a bowel movement then states it looks like she is on her period in the toilet bowl.  States there is some stinging pain in the gluteal cleft that is worse with defecation.  Denies severe pain with passing bowel movement.  Denies abnormal weight loss.  HPI  Past Medical History:  Diagnosis Date   Hyperthyroidism     Patient Active Problem List   Diagnosis Date Noted   Iron deficiency anemia secondary to blood loss (chronic) 04/29/2021   Pelvic pain 03/31/2021   Intramural uterine fibroid 03/30/2021   Leucocytosis 03/30/2021   Pelvic pain in female 03/30/2021   Fibroid uterus 03/30/2021   Hyperthyroidism     Past Surgical History:  Procedure Laterality Date   ABDOMINAL HYSTERECTOMY     CESAREAN SECTION     HERNIA REPAIR     IR RADIOLOGIST EVAL & MGMT  11/05/2019   THYROIDECTOMY      OB History   No obstetric history on file.      Home Medications    Prior to Admission medications   Medication Sig Start Date End Date Taking? Authorizing Provider  gabapentin (NEURONTIN) 300 MG capsule Take 300 mg by mouth 3 (three) times daily. 05/30/21   [provider]  HYDROcodone-acetaminophen (NORCO/VICODIN) 5-325 MG tablet Take 1-2 tablets by mouth every 4 (four) hours as needed. 03/29/21   Malvin Johns, MD   ibuprofen (ADVIL) 600 MG tablet Take 600 mg by mouth every 6 (six) hours as needed. 04/15/21   [provider]  iron polysaccharides (NIFEREX) 150 MG capsule Take 1 capsule (150 mg total) by mouth daily. 04/02/21   Servando Salina, MD  levothyroxine (SYNTHROID) 100 MCG tablet Take 100 mcg by mouth every morning. 03/18/21   [provider]  ondansetron (ZOFRAN) 4 MG tablet Take 4 mg by mouth every 8 (eight) hours as needed for nausea/vomiting. 03/30/21   [provider]  polyethylene glycol powder (GLYCOLAX/MIRALAX) 17 GM/SCOOP powder Take 17 g by mouth daily. 04/15/21   [provider]  Polysacchar Iron-FA-B12 (FERREX 150 FORTE) 150-1-25 MG-MG-MCG CAPS Take 1 capsule by mouth daily. 04/02/21   [provider]  tranexamic acid (LYSTEDA) 650 MG TABS tablet Take by mouth. 04/05/21   [provider]  Vitamin D, Ergocalciferol, (DRISDOL) 1.25 MG (50000 UNIT) CAPS capsule Take 50,000 Units by mouth once a week. 01/19/21   [provider]    Family History Family History  Problem Relation Age of Onset   Cancer Maternal Grandfather        prostate cancer    Social History Social History   Tobacco Use   Smoking status: Former    Packs/day: 0.25    Years: 20.00    Pack years: 5.00    Types: Cigarettes  Quit date: 12/19/2015    Years since quitting: 5.8   Smokeless tobacco: Former  Substance Use Topics   Alcohol use: Yes    Comment: occasionally   Drug use: No     Allergies   Latex and Percocet [oxycodone-acetaminophen]   Review of Systems Review of Systems  Gastrointestinal:  Positive for anal bleeding.  All other systems reviewed and are negative.   Physical Exam Triage Vital Signs ED Triage Vitals [10/31/21 1529]  Enc Vitals Group     BP 121/69     Pulse Rate 62     Resp 16     Temp 98.3 F (36.8 C)     Temp Source Oral     SpO2 99 %     Weight      Height      Head Circumference      Peak Flow      Pain  Score 7     Pain Loc      Pain Edu?      Excl. in [place]?    No data found.  Updated Vital Signs BP 121/69 (BP Location: Left Arm)   Pulse 62   Temp 98.3 F (36.8 C) (Oral)   Resp 16   LMP 03/16/2021   SpO2 99%   Visual Acuity Right Eye Distance:   Left Eye Distance:   Bilateral Distance:    Right Eye Near:   Left Eye Near:    Bilateral Near:     Physical Exam Vitals reviewed.  Constitutional:      General: She is not in acute distress.    Appearance: Normal appearance. She is not ill-appearing.  HENT:     Head: Normocephalic and atraumatic.     Mouth/Throat:     Mouth: Mucous membranes are moist.     Comments: Moist mucous membranes Eyes:     Extraocular Movements: Extraocular movements intact.     Pupils: Pupils are equal, round, and reactive to light.  Cardiovascular:     Rate and Rhythm: Normal rate and regular rhythm.     Heart sounds: Normal heart sounds.  Pulmonary:     Effort: Pulmonary effort is normal.     Breath sounds: Normal breath sounds. No wheezing, rhonchi or rales.  Abdominal:     General: Bowel sounds are normal. There is no distension.     Palpations: Abdomen is soft. There is no mass.     Tenderness: There is no abdominal tenderness. There is no right CVA tenderness, left CVA tenderness, guarding or rebound.  Genitourinary:    Comments: ChaperoneRamond Marrow, RN No external hemorrhoid or fissure. No abscess in gluteal cleft. No reproducible pain. No blood externally. Skin:    General: Skin is warm.     Capillary Refill: Capillary refill takes less than 2 seconds.     Comments: Good skin turgor  Neurological:     General: No focal deficit present.     Mental Status: She is alert and oriented to person, place, and time.  Psychiatric:        Mood and Affect: Mood normal.        Behavior: Behavior normal.     UC Treatments / Results  Labs (all labs ordered are listed, but only abnormal results are displayed) Labs Reviewed - No data to  display  EKG   Radiology No results found.  Procedures Procedures (including critical care time)  Medications Ordered in UC Medications - No data to display  Initial Impression /  Assessment and Plan / UC Course  I have reviewed the triage vital signs and the nursing notes.  Pertinent labs & imaging results that were available during my care of the patient were reviewed by me and considered in my medical decision making (see chart for details).     This patient is a very pleasant 40 y.o. year old female presenting with rectal bleeding. Today this pt is afebrile nontachycardic nontachypneic. On GU exam, no hemorrhoid fissure or abscess is visible.   Discussed that DDx includes internal hemorrhoid versus atypical abscess; advised her to call her primary care today, if they can get her in with GI quickly then she can follow with them, otherwise she will have to present to the emergency department.  She is in agreement. .   Final Clinical Impressions(s) / UC Diagnoses   Final diagnoses:  Rectal bleeding     Discharge Instructions      ED via personal vehicle     ED Prescriptions   None    PDMP not reviewed this encounter.   Hazel Sams, PA-C 10/31/21 1639

## 2021-11-17 ENCOUNTER — Ambulatory Visit
Admission: EM | Admit: 2021-11-17 | Discharge: 2021-11-17 | Disposition: A | Discharge status: Home / Self Care | Payer: BC Managed Care – PPO | Attending: Physician Assistant | Admitting: Physician Assistant

## 2021-11-17 ENCOUNTER — Other Ambulatory Visit: Payer: Self-pay

## 2021-11-17 DIAGNOSIS — J029 Acute pharyngitis, unspecified: Secondary | ICD-10-CM | POA: Diagnosis not present

## 2021-11-17 NOTE — ED Triage Notes (Signed)
Pt states woke up this am with a scratchy throat and feeling cold. Denies taking any meds.

## 2021-11-17 NOTE — ED Provider Notes (Signed)
[facility]    CSN: [id number] Arrival date & time: 11/17/21  1552      History   Chief Complaint Chief Complaint  Patient presents with   Sore Throat    HPI Tasheba Henson is a 40 y.o. female.   Patient is here today for evaluation of a scratchy throat. She reports symptoms started this morning. She states she also feels "Warm" and thinks she may be developing fever. She has taken mucinex without significant relief. She denies any cough. She has not had any significant congestion. Son is here today for evaluation after exposure to RSV.  The history is provided by the patient.  Sore Throat Pertinent negatives include no abdominal pain and no shortness of breath.   Past Medical History:  Diagnosis Date   Hyperthyroidism     Patient Active Problem List   Diagnosis Date Noted   Iron deficiency anemia secondary to blood loss (chronic) 04/29/2021   Pelvic pain 03/31/2021   Intramural uterine fibroid 03/30/2021   Leucocytosis 03/30/2021   Pelvic pain in female 03/30/2021   Fibroid uterus 03/30/2021   Hyperthyroidism     Past Surgical History:  Procedure Laterality Date   ABDOMINAL HYSTERECTOMY     CESAREAN SECTION     HERNIA REPAIR     IR RADIOLOGIST EVAL & MGMT  11/05/2019   THYROIDECTOMY      OB History   No obstetric history on file.      Home Medications    Prior to Admission medications   Medication Sig Start Date End Date Taking? Authorizing Provider  gabapentin (NEURONTIN) 300 MG capsule Take 300 mg by mouth 3 (three) times daily. 05/30/21   [provider]  HYDROcodone-acetaminophen (NORCO/VICODIN) 5-325 MG tablet Take 1-2 tablets by mouth every 4 (four) hours as needed. 03/29/21   Malvin Johns, MD  ibuprofen (ADVIL) 600 MG tablet Take 600 mg by mouth every 6 (six) hours as needed. 04/15/21   [provider]  iron polysaccharides (NIFEREX) 150 MG capsule Take 1 capsule (150 mg total) by mouth daily. 04/02/21    Servando Salina, MD  levothyroxine (SYNTHROID) 100 MCG tablet Take 100 mcg by mouth every morning. 03/18/21   [provider]  ondansetron (ZOFRAN) 4 MG tablet Take 4 mg by mouth every 8 (eight) hours as needed for nausea/vomiting. 03/30/21   [provider]  polyethylene glycol powder (GLYCOLAX/MIRALAX) 17 GM/SCOOP powder Take 17 g by mouth daily. 04/15/21   [provider]  Polysacchar Iron-FA-B12 (FERREX 150 FORTE) 150-1-25 MG-MG-MCG CAPS Take 1 capsule by mouth daily. 04/02/21   [provider]  tranexamic acid (LYSTEDA) 650 MG TABS tablet Take by mouth. 04/05/21   [provider]  Vitamin D, Ergocalciferol, (DRISDOL) 1.25 MG (50000 UNIT) CAPS capsule Take 50,000 Units by mouth once a week. 01/19/21   [provider]    Family History Family History  Problem Relation Age of Onset   Cancer Maternal Grandfather        prostate cancer    Social History Social History   Tobacco Use   Smoking status: Former    Packs/day: 0.25    Years: 20.00    Pack years: 5.00    Types: Cigarettes    Quit date: 12/19/2015    Years since quitting: 5.9   Smokeless tobacco: Former  Substance Use Topics   Alcohol use: Yes    Comment: occasionally   Drug use: No     Allergies   Latex and  Percocet [oxycodone-acetaminophen]   Review of Systems Review of Systems  Constitutional:  Positive for chills and fever (subjective).  HENT:  Positive for sore throat. Negative for congestion and ear pain.   Eyes:  Negative for discharge and redness.  Respiratory:  Negative for cough, shortness of breath and wheezing.   Gastrointestinal:  Negative for abdominal pain, diarrhea, nausea and vomiting.    Physical Exam Triage Vital Signs ED Triage Vitals  Enc Vitals Group     BP 11/17/21 1803 131/87     Pulse Rate 11/17/21 1803 80     Resp 11/17/21 1803 18     Temp 11/17/21 1803 98.7 F (37.1 C)     Temp Source 11/17/21 1803 Oral     SpO2 11/17/21 1803  98 %     Weight --      Height --      Head Circumference --      Peak Flow --      Pain Score 11/17/21 1804 0     Pain Loc --      Pain Edu? --      Excl. in [place]? --    No data found.  Updated Vital Signs BP 131/87 (BP Location: Left Arm)   Pulse 80   Temp 98.7 F (37.1 C) (Oral)   Resp 18   LMP 03/16/2021   SpO2 98%   Physical Exam Vitals and nursing note reviewed.  Constitutional:      General: She is not in acute distress.    Appearance: Normal appearance. She is not ill-appearing.  HENT:     Head: Normocephalic and atraumatic.     Nose: No congestion or rhinorrhea.     Mouth/Throat:     Mouth: Mucous membranes are moist.     Pharynx: No oropharyngeal exudate or posterior oropharyngeal erythema.  Eyes:     Conjunctiva/sclera: Conjunctivae normal.  Cardiovascular:     Rate and Rhythm: Normal rate and regular rhythm.     Heart sounds: Normal heart sounds. No murmur heard. Pulmonary:     Effort: Pulmonary effort is normal. No respiratory distress.     Breath sounds: Normal breath sounds. No wheezing, rhonchi or rales.  Skin:    General: Skin is warm and dry.  Neurological:     Mental Status: She is alert.  Psychiatric:        Mood and Affect: Mood normal.        Thought Content: Thought content normal.     UC Treatments / Results  Labs (all labs ordered are listed, but only abnormal results are displayed) Labs Reviewed  COVID-19, FLU A+B AND RSV    EKG   Radiology No results found.  Procedures Procedures (including critical care time)  Medications Ordered in UC Medications - No data to display  Initial Impression / Assessment and Plan / UC Course  I have reviewed the triage vital signs and the nursing notes.  Pertinent labs & imaging results that were available during my care of the patient were reviewed by me and considered in my medical decision making (see chart for details).   Suspect most likely viral etiology of symptoms and recommended  symptomatic treatment. Will order flu, covid and rsv screening. Recommend follow up with any concerns.   Final Clinical Impressions(s) / UC Diagnoses   Final diagnoses:  Acute pharyngitis, unspecified etiology   Discharge Instructions   None    ED Prescriptions   None    PDMP not reviewed this  encounter.   Francene Finders, PA-C 11/17/21 1840

## 2021-11-18 LAB — COVID-19, FLU A+B AND RSV
Influenza A, NAA: NOT DETECTED
Influenza B, NAA: NOT DETECTED
RSV, NAA: NOT DETECTED
SARS-CoV-2, NAA: NOT DETECTED

## 2021-11-28 ENCOUNTER — Inpatient Hospital Stay: Payer: BC Managed Care – PPO | Attending: Hematology | Admitting: Hematology

## 2021-11-28 ENCOUNTER — Inpatient Hospital Stay: Payer: BC Managed Care – PPO

## 2021-11-28 ENCOUNTER — Encounter: Payer: Self-pay | Admitting: Hematology

## 2021-11-28 ENCOUNTER — Other Ambulatory Visit: Payer: Self-pay

## 2021-11-28 VITALS — BP 119/62 | HR 74 | Temp 97.9°F | Resp 18 | Ht 63.0 in | Wt 185.9 lb

## 2021-11-28 DIAGNOSIS — D500 Iron deficiency anemia secondary to blood loss (chronic): Secondary | ICD-10-CM | POA: Insufficient documentation

## 2021-11-28 DIAGNOSIS — E890 Postprocedural hypothyroidism: Secondary | ICD-10-CM | POA: Insufficient documentation

## 2021-11-28 DIAGNOSIS — N920 Excessive and frequent menstruation with regular cycle: Secondary | ICD-10-CM | POA: Insufficient documentation

## 2021-11-28 LAB — CBC WITH DIFFERENTIAL (CANCER CENTER ONLY)
Abs Immature Granulocytes: 0.01 10*3/uL (ref 0.00–0.07)
Basophils Absolute: 0.1 10*3/uL (ref 0.0–0.1)
Basophils Relative: 1 %
Eosinophils Absolute: 0.2 10*3/uL (ref 0.0–0.5)
Eosinophils Relative: 3 %
HCT: 41.3 % (ref 36.0–46.0)
Hemoglobin: 13.8 g/dL (ref 12.0–15.0)
Immature Granulocytes: 0 %
Lymphocytes Relative: 42 %
Lymphs Abs: 2.2 10*3/uL (ref 0.7–4.0)
MCH: 31.2 pg (ref 26.0–34.0)
MCHC: 33.4 g/dL (ref 30.0–36.0)
MCV: 93.2 fL (ref 80.0–100.0)
Monocytes Absolute: 0.6 10*3/uL (ref 0.1–1.0)
Monocytes Relative: 12 %
Neutro Abs: 2.2 10*3/uL (ref 1.7–7.7)
Neutrophils Relative %: 42 %
Platelet Count: 306 10*3/uL (ref 150–400)
RBC: 4.43 MIL/uL (ref 3.87–5.11)
RDW: 12.2 % (ref 11.5–15.5)
WBC Count: 5.3 10*3/uL (ref 4.0–10.5)
nRBC: 0 % (ref 0.0–0.2)

## 2021-11-28 LAB — IRON AND TIBC
Iron: 62 ug/dL (ref 41–142)
Saturation Ratios: 17 % — ABNORMAL LOW (ref 21–57)
TIBC: 371 ug/dL (ref 236–444)
UIBC: 309 ug/dL (ref 120–384)

## 2021-11-28 LAB — FERRITIN: Ferritin: 70 ng/mL (ref 11–307)

## 2021-11-28 NOTE — Progress Notes (Signed)
Alison Scott   Telephone:([phone]) [phone] Fax:(336) [phone]   Clinic Follow up Note   Patient Care Team: Kathreen Devoid, PA-C as PCP - General (Internal Medicine) Servando Salina, MD as Consulting Physician (Obstetrics and Gynecology)  Date of Service:  11/28/2021  CHIEF COMPLAINT: f/u of anemia  CURRENT THERAPY:  IV Venofer as needed  ASSESSMENT & PLAN:  Alison Scott is a 40 y.o. female with   1. Anemia from bleeding and iron deficiency -Due to increasingly heavy menstrual bleeding from degenerative fibroids, she underwent hysterectomy by Dr Lanelle Bal at [facility] on 04/15/21. She remains to have her ovaries.   -04/27/21 labs with Gyn showed Hg has been lower (7.4) and platelet elevated (778K), low saturation, high TIBC, consistent with iron deficiency, which is likely the cause of her anemia. -she notes a family history of anemia on her father's side. -Her elevated platelet is likely reactive to iron deficiency. -She received IV Venofer, three doses in 04/2021 and one in 07/2021. She tolerated well. -labs reviewed, CBC is WNL, iron panel is pending. I will call her with the results.  -at this point, I recommend she continue oral iron for a few more months and continue lab work with her PCP and/or GYN. I will leave her f/u here open, but she knows to contact us if her anemia recurs.   2. S/p thyroidectomy  -On thyroid replacement    3. Age appropriate cancer screenings  -order is in place for her to begin mammography. She agrees to call [facility]. -history of colon cancer in her grandfather. She reports normal colonoscopy 2022, recall in 10 years. She is planning for repeat soon due to recent rectal bleeding     PLAN:  -I will contact her with the results of today's iron panel -iv venofer if ferritin<20 -lab with other physicians -f/u open if her iron level is adequate    No problem-specific Assessment & Plan notes found for this  encounter.   INTERVAL HISTORY:  Alison Scott is here for a follow up of anemia. She was last seen by me on 08/01/21. She presents to the clinic alone. She reports she felt better after her last iron infusion. She reports feeling tired again now.   All other systems were reviewed with the patient and are negative.  MEDICAL HISTORY:  Past Medical History:  Diagnosis Date   Hyperthyroidism     SURGICAL HISTORY: Past Surgical History:  Procedure Laterality Date   ABDOMINAL HYSTERECTOMY     CESAREAN SECTION     HERNIA REPAIR     IR RADIOLOGIST EVAL & MGMT  11/05/2019   THYROIDECTOMY      I have reviewed the social history and family history with the patient and they are unchanged from previous note.  ALLERGIES:  is allergic to latex and percocet [oxycodone-acetaminophen].  MEDICATIONS:  Current Outpatient Medications  Medication Sig Dispense Refill   gabapentin (NEURONTIN) 300 MG capsule Take 300 mg by mouth 3 (three) times daily.     HYDROcodone-acetaminophen (NORCO/VICODIN) 5-325 MG tablet Take 1-2 tablets by mouth every 4 (four) hours as needed. 15 tablet 0   ibuprofen (ADVIL) 600 MG tablet Take 600 mg by mouth every 6 (six) hours as needed.     iron polysaccharides (NIFEREX) 150 MG capsule Take 1 capsule (150 mg total) by mouth daily. 30 capsule 11   levothyroxine (SYNTHROID) 100 MCG tablet Take 100 mcg by mouth every morning.     ondansetron (ZOFRAN) 4  MG tablet Take 4 mg by mouth every 8 (eight) hours as needed for nausea/vomiting.     polyethylene glycol powder (GLYCOLAX/MIRALAX) 17 GM/SCOOP powder Take 17 g by mouth daily.     Polysacchar Iron-FA-B12 (FERREX 150 FORTE) 150-1-25 MG-MG-MCG CAPS Take 1 capsule by mouth daily.     tranexamic acid (LYSTEDA) 650 MG TABS tablet Take by mouth.     Vitamin D, Ergocalciferol, (DRISDOL) 1.25 MG (50000 UNIT) CAPS capsule Take 50,000 Units by mouth once a week.     No current facility-administered medications for this visit.     PHYSICAL EXAMINATION: ECOG PERFORMANCE STATUS: 1 - Symptomatic but completely ambulatory  Vitals:   11/28/21 1136  BP: 119/62  Pulse: 74  Resp: 18  Temp: 97.9 F (36.6 C)  SpO2: 100%   Wt Readings from Last 3 Encounters:  11/28/21 185 lb 14.4 oz (84.3 kg)  08/01/21 174 lb 3.2 oz (79 kg)  04/29/21 164 lb 9.6 oz (74.7 kg)     GENERAL:alert, no distress and comfortable SKIN: skin color normal, no rashes or significant lesions EYES: normal, Conjunctiva are pink and non-injected, sclera clear  NEURO: alert & oriented x 3 with fluent speech  LABORATORY DATA:  I have reviewed the data as listed CBC Latest Ref Rng & Units 11/28/2021 09/26/2021 08/01/2021  WBC 4.0 - 10.5 K/uL 5.3 6.6 6.9  Hemoglobin 12.0 - 15.0 g/dL 13.8 12.3 12.3  Hematocrit 36.0 - 46.0 % 41.3 36.6 38.2  Platelets 150 - 400 K/uL 306 265 293     CMP Latest Ref Rng & Units 03/29/2021 11/12/2019 12/01/2017  Glucose 70 - 99 mg/dL 126(H) - 93  BUN 6 - 20 mg/dL 5(L) - 7  Creatinine 0.44 - 1.00 mg/dL 0.61 0.70 0.64  Sodium 135 - 145 mmol/L 134(L) - 135  Potassium 3.5 - 5.1 mmol/L 3.5 - 3.8  Chloride 98 - 111 mmol/L 105 - 104  CO2 22 - 32 mmol/L 20(L) - 24  Calcium 8.9 - 10.3 mg/dL 8.7(L) - 8.6(L)  Total Protein 6.5 - 8.1 g/dL 7.7 - 8.0  Total Bilirubin 0.3 - 1.2 mg/dL 0.6 - 0.5  Alkaline Phos 38 - 126 U/L 49 - 69  AST 15 - 41 U/L 39 - 23  ALT 0 - 44 U/L 17 - 16      RADIOGRAPHIC STUDIES: I have personally reviewed the radiological images as listed and agreed with the findings in the report. No results found.    No orders of the defined types were placed in this encounter.  All questions were answered. The patient knows to call the clinic with any problems, questions or concerns. No barriers to learning was detected. The total time spent in the appointment was 20 minutes.     Truitt Merle, MD 11/28/2021   I, Wilburn Mylar, am acting as scribe for Truitt Merle, MD.   I have reviewed the above  documentation for accuracy and completeness, and I agree with the above.

## 2021-11-29 ENCOUNTER — Encounter: Payer: Self-pay | Admitting: Hematology

## 2022-07-11 ENCOUNTER — Other Ambulatory Visit: Payer: Self-pay

## 2022-07-11 ENCOUNTER — Encounter: Payer: Self-pay | Admitting: Emergency Medicine

## 2022-07-11 ENCOUNTER — Ambulatory Visit
Admission: EM | Admit: 2022-07-11 | Discharge: 2022-07-11 | Disposition: A | Discharge status: Other Acute Inpatient Hospital | Payer: BC Managed Care – PPO

## 2022-07-11 ENCOUNTER — Emergency Department (HOSPITAL_BASED_OUTPATIENT_CLINIC_OR_DEPARTMENT_OTHER)
Admission: EM | Admit: 2022-07-11 | Discharge: 2022-07-11 | Disposition: A | Discharge status: Home / Self Care | Payer: BC Managed Care – PPO | Attending: Emergency Medicine | Admitting: Emergency Medicine

## 2022-07-11 ENCOUNTER — Encounter (HOSPITAL_BASED_OUTPATIENT_CLINIC_OR_DEPARTMENT_OTHER): Payer: Self-pay

## 2022-07-11 DIAGNOSIS — R197 Diarrhea, unspecified: Secondary | ICD-10-CM | POA: Diagnosis not present

## 2022-07-11 DIAGNOSIS — R319 Hematuria, unspecified: Secondary | ICD-10-CM | POA: Diagnosis not present

## 2022-07-11 DIAGNOSIS — Z91040 Latex allergy status: Secondary | ICD-10-CM | POA: Diagnosis not present

## 2022-07-11 DIAGNOSIS — R310 Gross hematuria: Secondary | ICD-10-CM

## 2022-07-11 DIAGNOSIS — E039 Hypothyroidism, unspecified: Secondary | ICD-10-CM | POA: Diagnosis not present

## 2022-07-11 DIAGNOSIS — R112 Nausea with vomiting, unspecified: Secondary | ICD-10-CM | POA: Diagnosis present

## 2022-07-11 DIAGNOSIS — E876 Hypokalemia: Secondary | ICD-10-CM | POA: Diagnosis not present

## 2022-07-11 DIAGNOSIS — Z79899 Other long term (current) drug therapy: Secondary | ICD-10-CM | POA: Insufficient documentation

## 2022-07-11 DIAGNOSIS — R519 Headache, unspecified: Secondary | ICD-10-CM | POA: Insufficient documentation

## 2022-07-11 LAB — COMPREHENSIVE METABOLIC PANEL
ALT: 37 U/L (ref 0–44)
AST: 39 U/L (ref 15–41)
Albumin: 4.5 g/dL (ref 3.5–5.0)
Alkaline Phosphatase: 46 U/L (ref 38–126)
Anion gap: 13 (ref 5–15)
BUN: 10 mg/dL (ref 6–20)
CO2: 20 mmol/L — ABNORMAL LOW (ref 22–32)
Calcium: 9.6 mg/dL (ref 8.9–10.3)
Chloride: 106 mmol/L (ref 98–111)
Creatinine, Ser: 0.61 mg/dL (ref 0.44–1.00)
GFR, Estimated: >60 mL/min (ref >60)
Glucose, Bld: 83 mg/dL (ref 70–99)
Potassium: 3.4 mmol/L — ABNORMAL LOW (ref 3.5–5.1)
Sodium: 139 mmol/L (ref 135–145)
Total Bilirubin: 0.5 mg/dL (ref 0.3–1.2)
Total Protein: 7.3 g/dL (ref 6.5–8.1)

## 2022-07-11 LAB — CBC WITH DIFFERENTIAL/PLATELET
Abs Immature Granulocytes: 0.01 10*3/uL (ref 0.00–0.07)
Basophils Absolute: 0 10*3/uL (ref 0.0–0.1)
Basophils Relative: 0 %
Eosinophils Absolute: 0.2 10*3/uL (ref 0.0–0.5)
Eosinophils Relative: 6 %
HCT: 39.1 % (ref 36.0–46.0)
Hemoglobin: 13.5 g/dL (ref 12.0–15.0)
Immature Granulocytes: 0 %
Lymphocytes Relative: 40 %
Lymphs Abs: 1.5 10*3/uL (ref 0.7–4.0)
MCH: 30.1 pg (ref 26.0–34.0)
MCHC: 34.5 g/dL (ref 30.0–36.0)
MCV: 87.3 fL (ref 80.0–100.0)
Monocytes Absolute: 0.7 10*3/uL (ref 0.1–1.0)
Monocytes Relative: 17 %
Neutro Abs: 1.4 10*3/uL — ABNORMAL LOW (ref 1.7–7.7)
Neutrophils Relative %: 37 %
Platelets: 260 10*3/uL (ref 150–400)
RBC: 4.48 MIL/uL (ref 3.87–5.11)
RDW: 11.9 % (ref 11.5–15.5)
WBC: 3.7 10*3/uL — ABNORMAL LOW (ref 4.0–10.5)
nRBC: 0 % (ref 0.0–0.2)

## 2022-07-11 LAB — LIPASE, BLOOD: Lipase: 50 U/L (ref 11–51)

## 2022-07-11 LAB — POCT FASTING CBG KUC MANUAL ENTRY: POCT Glucose (KUC): 77 mg/dL (ref 70–99)

## 2022-07-11 MED ORDER — POTASSIUM CHLORIDE CRYS ER 20 MEQ PO TBCR
60.0000 meq | EXTENDED_RELEASE_TABLET | Freq: Once | ORAL | Status: AC
  Administered 2022-07-11: 60 meq via ORAL
  Filled 2022-07-11: qty 3

## 2022-07-11 MED ORDER — ONDANSETRON HCL 4 MG/2ML IJ SOLN
4.0000 mg | Freq: Once | INTRAMUSCULAR | Status: AC
  Administered 2022-07-11: 4 mg via INTRAVENOUS
  Filled 2022-07-11: qty 2

## 2022-07-11 MED ORDER — SODIUM CHLORIDE 0.9 % IV BOLUS
1000.0000 mL | Freq: Once | INTRAVENOUS | Status: AC
Start: 2022-07-11 — End: 2022-07-11
  Administered 2022-07-11: 1000 mL via INTRAVENOUS

## 2022-07-11 NOTE — ED Provider Notes (Signed)
[name]    CSN: [id number] Arrival date & time: 07/11/22  0857      History   Chief Complaint Chief Complaint  Patient presents with   Nausea    HPI Alison Scott is a 41 y.o. female.   Patient presents with 1 to 2-week history of nausea, vomiting, diarrhea.  Patient reports that it is very difficult to keep any water down.  She last drank fluids yesterday.  Denies blood in stool or emesis.  Denies any associated abdominal pain.  Patient reports that symptoms started after her Ozempic dose for diabetes was increased.  She also feels very fatigued and weak.  Patient also reports that she has noticed some dark-colored blood in her urine that started a few days ago.  She reports it is intermittent and not every urine stream.  Denies urinary burning, urinary frequency, back pain, fever, vaginal discharge, abnormal vaginal bleeding.     Past Medical History:  Diagnosis Date   Hyperthyroidism     Patient Active Problem List   Diagnosis Date Noted   Iron deficiency anemia secondary to blood loss (chronic) 04/29/2021   Pelvic pain 03/31/2021   Intramural uterine fibroid 03/30/2021   Leucocytosis 03/30/2021   Pelvic pain in female 03/30/2021   Fibroid uterus 03/30/2021   Hyperthyroidism     Past Surgical History:  Procedure Laterality Date   ABDOMINAL HYSTERECTOMY     CESAREAN SECTION     HERNIA REPAIR     IR RADIOLOGIST EVAL & MGMT  11/05/2019   THYROIDECTOMY      OB History   No obstetric history on file.      Home Medications    Prior to Admission medications   Medication Sig Start Date End Date Taking? Authorizing Provider  Semaglutide (OZEMPIC, 1 MG/DOSE, [place]) Inject into the skin.   Yes [provider]  gabapentin (NEURONTIN) 300 MG capsule Take 300 mg by mouth 3 (three) times daily. Patient not taking: Reported on 07/11/2022 05/30/21   [provider]  HYDROcodone-acetaminophen (NORCO/VICODIN) 5-325 MG tablet Take  1-2 tablets by mouth every 4 (four) hours as needed. Patient not taking: Reported on 07/11/2022 03/29/21   Malvin Johns, MD  ibuprofen (ADVIL) 600 MG tablet Take 600 mg by mouth every 6 (six) hours as needed. 04/15/21   [provider]  iron polysaccharides (NIFEREX) 150 MG capsule Take 1 capsule (150 mg total) by mouth daily. 04/02/21   Servando Salina, MD  levothyroxine (SYNTHROID) 100 MCG tablet Take 100 mcg by mouth every morning. 03/18/21   [provider]  ondansetron (ZOFRAN) 4 MG tablet Take 4 mg by mouth every 8 (eight) hours as needed for nausea/vomiting. 03/30/21   [provider]  polyethylene glycol powder (GLYCOLAX/MIRALAX) 17 GM/SCOOP powder Take 17 g by mouth daily. 04/15/21   [provider]  Polysacchar Iron-FA-B12 (FERREX 150 FORTE) 150-1-25 MG-MG-MCG CAPS Take 1 capsule by mouth daily. 04/02/21   [provider]  tranexamic acid (LYSTEDA) 650 MG TABS tablet Take by mouth. 04/05/21   [provider]  Vitamin D, Ergocalciferol, (DRISDOL) 1.25 MG (50000 UNIT) CAPS capsule Take 50,000 Units by mouth once a week. 01/19/21   [provider]    Family History Family History  Problem Relation Age of Onset   Cancer Maternal Grandfather        prostate cancer    Social History Social History   Tobacco Use   Smoking status: Former    Packs/day: 0.25  Years: 20.00    Total pack years: 5.00    Types: Cigarettes    Quit date: 12/19/2015    Years since quitting: 6.5   Smokeless tobacco: Former  Substance Use Topics   Alcohol use: Yes    Comment: occasionally   Drug use: No     Allergies   Latex and Percocet [oxycodone-acetaminophen]   Review of Systems Review of Systems Per HPI  Physical Exam Triage Vital Signs ED Triage Vitals  Enc Vitals Group     BP 07/11/22 0917 125/89     Pulse Rate 07/11/22 0917 62     Resp 07/11/22 0917 18     Temp 07/11/22 0917 98.3 F (36.8 C)     Temp Source 07/11/22 0917  Oral     SpO2 07/11/22 0917 98 %     Weight --      Height --      Head Circumference --      Peak Flow --      Pain Score 07/11/22 0918 0     Pain Loc --      Pain Edu? --      Excl. in [place]? --    No data found.  Updated Vital Signs BP 125/89 (BP Location: Left Arm)   Pulse 62   Temp 98.3 F (36.8 C) (Oral)   Resp 18   LMP 03/16/2021   SpO2 98%   Visual Acuity Right Eye Distance:   Left Eye Distance:   Bilateral Distance:    Right Eye Near:   Left Eye Near:    Bilateral Near:     Physical Exam Constitutional:      General: She is not in acute distress.    Appearance: Normal appearance. She is ill-appearing. She is not toxic-appearing or diaphoretic.     Comments: Patient is tearful.  HENT:     Head: Normocephalic and atraumatic.     Mouth/Throat:     Mouth: Mucous membranes are dry.     Pharynx: No posterior oropharyngeal erythema.  Eyes:     Extraocular Movements: Extraocular movements intact.     Conjunctiva/sclera: Conjunctivae normal.  Cardiovascular:     Rate and Rhythm: Normal rate and regular rhythm.     Pulses: Normal pulses.     Heart sounds: Normal heart sounds.  Pulmonary:     Effort: Pulmonary effort is normal. No respiratory distress.     Breath sounds: Normal breath sounds.  Abdominal:     General: Bowel sounds are normal. There is no distension.     Palpations: Abdomen is soft.     Tenderness: There is no abdominal tenderness.  Skin:    General: Skin is warm and dry.  Neurological:     General: No focal deficit present.     Mental Status: She is alert and oriented to person, place, and time. Mental status is at baseline.  Psychiatric:        Mood and Affect: Mood normal.        Behavior: Behavior normal.        Thought Content: Thought content normal.        Judgment: Judgment normal.      UC Treatments / Results  Labs (all labs ordered are listed, but only abnormal results are displayed) Labs Reviewed  POCT FASTING [facility] [place]    EKG   Radiology No results found.  Procedures Procedures (including critical care time)  Medications Ordered in UC Medications - No  data to display  Initial Impression / Assessment and Plan / UC Course  I have reviewed the triage vital signs and the nursing notes.  Pertinent labs & imaging results that were available during my care of the patient were reviewed by me and considered in my medical decision making (see chart for details).     I am very concerned the patient could be dehydrated given persistent nausea, vomiting, diarrhea with associated weakness and fatigue.  There is also concern with dark-colored urine which necessitates further work-up.  I do think patient needs more evaluation and management than can be provided here at the urgent care given limited resources.  Advised patient to go to the hospital for further evaluation and management.  Patient was agreeable  with plan.  Vital signs stable at discharge.  Agree with patient self transport to hospital. Final Clinical Impressions(s) / UC Diagnoses   Final diagnoses:  Nausea vomiting and diarrhea  Gross hematuria     Discharge Instructions      Please go to the emergency department as soon as you leave urgent care for further evaluation and management.  I am concerned that you may be dehydrated and need further work-up.  I have attached the addresses for the two med centers if you prefer to go there.    ED Prescriptions   None    PDMP not reviewed this encounter.   Teodora Medici, [place] 07/11/22 [phone]

## 2022-07-11 NOTE — ED Notes (Signed)
Patient is being discharged from the Urgent Care and sent to the Emergency Department via POV . Per HM, patient is in need of higher level of care due to weakness. Patient is aware and verbalizes understanding of plan of care.  Vitals:   07/11/22 0917  BP: 125/89  Pulse: 62  Resp: 18  Temp: 98.3 F (36.8 C)  SpO2: 98%

## 2022-07-11 NOTE — ED Triage Notes (Signed)
Pt here for nausea and vomiting x 1 week since upping her ozempic; pt sts feeling generally weak this am; pt sts some blood in her urine

## 2022-07-11 NOTE — ED Triage Notes (Signed)
States started ozempic shot 3 months ago, recently started a higher dose, been having N/V/D. Decreased oral intake. States has had blood in urine. Near syncope in shower.   Sent here by UC for further eval.

## 2022-07-11 NOTE — ED Provider Notes (Cosign Needed Addendum)
[facility] EMERGENCY DEPARTMENT Provider Note   CSN: [id number] Arrival date & time: 07/11/22  6962     History  Chief Complaint  Patient presents with   Hematuria   Diarrhea    Alison Scott is a 41 y.o. female with medical history of hypothyroidism.  The patient presents to the ED for evaluation of nausea, vomiting and diarrhea.  The patient states that the symptoms been occurring since Friday night/Saturday morning.  Patient states that for the last 3 months that she has been taking Ozempic for weight loss.  Patient states that she is "prediabetic" but does not have any diagnosis of diabetes.  Patient reports that she was placed on this medication by her PCP.  Patient reports that she has been taking 0.5 mg of this medication.  Patient states that on Friday she increased her dose to 1 mg per PCP.  The patient states that night she began experiencing nausea, vomiting and diarrhea as well as blood in her urine.  The patient reports that she had some mild symptoms similar to these when she started the medication but these resolved on their own.  The patient states that the symptoms have persisted throughout the weekend and into today.  The patient states that she has been having decreased oral intake, only able to eat soup.  Patient reports that this morning upon waking she got into the shower and became lightheaded and weak, dizzy.  Patient reports that she went to urgent care who advised her to proceed to the ER for further management/work-up.  Patient is endorsing nausea, vomiting, diarrhea, blood in urine, headache.  Patient denies any fevers, abdominal pain, dysuria, back pain, flank pain, vaginal discharge, blood in vomit, blood in stool.   Hematuria Associated symptoms include headaches. Pertinent negatives include no abdominal pain.  Diarrhea Associated symptoms: headaches and vomiting   Associated symptoms: no abdominal pain, no chills and no fever        Home  Medications Prior to Admission medications   Medication Sig Start Date End Date Taking? Authorizing Provider  gabapentin (NEURONTIN) 300 MG capsule Take 300 mg by mouth 3 (three) times daily. Patient not taking: Reported on 07/11/2022 05/30/21   [provider]  HYDROcodone-acetaminophen (NORCO/VICODIN) 5-325 MG tablet Take 1-2 tablets by mouth every 4 (four) hours as needed. Patient not taking: Reported on 07/11/2022 03/29/21   Malvin Johns, MD  ibuprofen (ADVIL) 600 MG tablet Take 600 mg by mouth every 6 (six) hours as needed. 04/15/21   [provider]  iron polysaccharides (NIFEREX) 150 MG capsule Take 1 capsule (150 mg total) by mouth daily. 04/02/21   Servando Salina, MD  levothyroxine (SYNTHROID) 100 MCG tablet Take 100 mcg by mouth every morning. 03/18/21   [provider]  ondansetron (ZOFRAN) 4 MG tablet Take 4 mg by mouth every 8 (eight) hours as needed for nausea/vomiting. 03/30/21   [provider]  polyethylene glycol powder (GLYCOLAX/MIRALAX) 17 GM/SCOOP powder Take 17 g by mouth daily. 04/15/21   [provider]  Polysacchar Iron-FA-B12 (FERREX 150 FORTE) 150-1-25 MG-MG-MCG CAPS Take 1 capsule by mouth daily. 04/02/21   [provider]  Semaglutide (OZEMPIC, 1 MG/DOSE, [place]) Inject into the skin.    [provider]  tranexamic acid (LYSTEDA) 650 MG TABS tablet Take by mouth. 04/05/21   [provider]  Vitamin D, Ergocalciferol, (DRISDOL) 1.25 MG (50000 UNIT) CAPS capsule Take 50,000 Units by mouth once a week. 01/19/21   [provider]  Allergies    Latex and Percocet [oxycodone-acetaminophen]    Review of Systems   Review of Systems  Constitutional:  Negative for chills and fever.  Gastrointestinal:  Positive for diarrhea, nausea and vomiting. Negative for abdominal pain and blood in stool.  Genitourinary:  Positive for hematuria. Negative for dysuria, flank pain and vaginal discharge.   Musculoskeletal:  Negative for back pain.  Neurological:  Positive for dizziness, weakness and headaches. Negative for syncope and numbness.  All other systems reviewed and are negative.   Physical Exam Updated Vital Signs BP 120/75   Pulse 83   Temp 98.2 F (36.8 C) (Oral)   Resp 14   Ht '5\' 3"'$  (1.6 m)   Wt 76.7 kg   LMP 03/16/2021   SpO2 100%   BMI 29.94 kg/m  Physical Exam Vitals and nursing note reviewed.  Constitutional:      General: She is not in acute distress.    Appearance: Normal appearance. She is not ill-appearing, toxic-appearing or diaphoretic.     Comments: Patient speaking in full sentences, alert and oriented, nontoxic in appearance  HENT:     Head: Normocephalic and atraumatic.     Nose: Nose normal. No congestion.     Mouth/Throat:     Mouth: Mucous membranes are moist.     Pharynx: Oropharynx is clear.  Eyes:     Conjunctiva/sclera: Conjunctivae normal.     Pupils: Pupils are equal, round, and reactive to light.  Cardiovascular:     Rate and Rhythm: Normal rate and regular rhythm.  Pulmonary:     Effort: Pulmonary effort is normal.     Breath sounds: Normal breath sounds. No wheezing.  Abdominal:     General: Abdomen is flat. Bowel sounds are normal.     Palpations: Abdomen is soft.     Tenderness: There is no abdominal tenderness. There is no right CVA tenderness, left CVA tenderness, guarding or rebound.     Comments: No abdominal tenderness elicited on examination.  All 4 quadrants of abdomen soft and compressible  Musculoskeletal:     Cervical back: Normal range of motion and neck supple. No tenderness.  Skin:    General: Skin is warm and dry.     Capillary Refill: Capillary refill takes less than 2 seconds.  Neurological:     Mental Status: She is alert and oriented to person, place, and time.     ED Results / Procedures / Treatments   Labs (all labs ordered are listed, but only abnormal results are displayed) Labs Reviewed  CBC WITH  DIFFERENTIAL/PLATELET - Abnormal; Notable for the following components:      Result Value   WBC 3.7 (*)    Neutro Abs 1.4 (*)    All other components within normal limits  COMPREHENSIVE METABOLIC PANEL - Abnormal; Notable for the following components:   Potassium 3.4 (*)    CO2 20 (*)    All other components within normal limits  LIPASE, BLOOD  URINALYSIS, ROUTINE W REFLEX MICROSCOPIC    EKG EKG Interpretation  Date/Time:  Tuesday July 11 2022 10:08:32 EDT Ventricular Rate:  79 PR Interval:  119 QRS Duration: 90 QT Interval:  406 QTC Calculation: 466 R Axis:   43 Text Interpretation: Sinus rhythm Ventricular premature complex Borderline short PR interval No significant change since last tracing Confirmed by Deno Etienne [phone]) on 07/11/2022 11:00:30 AM  Radiology No results found.  Procedures Procedures   Medications Ordered in ED Medications  ondansetron (ZOFRAN) injection  4 mg (4 mg Intravenous Given 07/11/22 1026)  sodium chloride 0.9 % bolus 1,000 mL (0 mLs Intravenous Stopped 07/11/22 1200)  sodium chloride 0.9 % bolus 1,000 mL (0 mLs Intravenous Stopped 07/11/22 1333)  potassium chloride SA (KLOR-CON M) CR tablet 60 mEq (60 mEq Oral Given 07/11/22 1244)    ED Course/ Medical Decision Making/ A&P                           Medical Decision Making Amount and/or Complexity of Data Reviewed Labs: ordered.  Risk Prescription drug management.   41 year old female presents to the ED for evaluation.  Please see HPI for further details.  On examination, the patient is afebrile and nontachycardic.  The patient lung sounds are clear bilaterally, she is not hypoxic on room air.  The patient's abdomen is soft and compressible all 4 quadrants.  The patient denies any abdominal tenderness.  Patient neurological examination shows no focal neurodeficits.  The patient has no CVA tenderness bilaterally.  Patient worked up utilizing the following labs and imaging studies interpreted  by me personally: - CBC with white blood cell count of 3.7 - CMP with decreased potassium to 3.4 repleted with 60 mEq of potassium - Lipase has not resulted because this is a lab send out.  The lab here at [facility] is not operating currently and these labs are send outs.  The patient denies any abdominal pain.  The patient denies any history of excess drinking or pancreatitis.  There is no umbilical bruising or flank bruising. - Urinalysis not collected.  The patient states that she is unable to urinate after 2 L of fluid.  Patient is endorsing hematuria however the patient's hemoglobin is stable at 13.5.  Patient denies any dysuria, flank pain.  Patient treated with 2 L normal saline, 4 mg of Zofran.  The patient states that she feels better after these medications were given.  The patient states that after the fluid was given, she feels much better.  The patient is ready for discharge.  The patient is requesting discharge.  Patient was given return precautions and she voiced understanding.  The patient was advised to follow-up with her PCP for medication management, advised to continue to rehydrate herself as an outpatient and eat a soft diet.  The patient voiced understanding of my instructions.  The patient had all her questions answered to her satisfaction prior to discharge.  The patient is stable at this time for discharge home.  Patient was offered prescription Zofran prior to discharge however the patient states that she has Zofran at home.  Final Clinical Impression(s) / ED Diagnoses Final diagnoses:  Nausea vomiting and diarrhea    Rx / DC Orders ED Discharge Orders     None            Azucena Cecil, PA-C 07/11/22 [place], [place], DO 07/12/22 1459

## 2022-07-11 NOTE — Discharge Instructions (Signed)
Please return to the ED with any new or worsening symptoms such as return of nausea and vomiting, diarrhea, abdominal pain Please follow-up with your PCP for further management of diabetic medications Please read attached guide concerning nausea and vomiting in adults Please continue to hydrate yourself as an outpatient.  Please continue to eat a diet agreeable to your stomach. Please utilize at home Zofran for nausea

## 2022-07-11 NOTE — Discharge Instructions (Addendum)
Please go to the emergency department as soon as you leave urgent care for further evaluation and management.  I am concerned that you may be dehydrated and need further work-up.  I have attached the addresses for the two med centers if you prefer to go there.

## 2022-09-24 ENCOUNTER — Other Ambulatory Visit: Payer: Self-pay

## 2022-09-24 ENCOUNTER — Emergency Department (HOSPITAL_BASED_OUTPATIENT_CLINIC_OR_DEPARTMENT_OTHER)
Admission: EM | Admit: 2022-09-24 | Discharge: 2022-09-24 | Disposition: A | Discharge status: Home / Self Care | Payer: BC Managed Care – PPO | Attending: Emergency Medicine | Admitting: Emergency Medicine

## 2022-09-24 DIAGNOSIS — K649 Unspecified hemorrhoids: Secondary | ICD-10-CM | POA: Insufficient documentation

## 2022-09-24 DIAGNOSIS — Z91040 Latex allergy status: Secondary | ICD-10-CM | POA: Insufficient documentation

## 2022-09-24 DIAGNOSIS — K6289 Other specified diseases of anus and rectum: Secondary | ICD-10-CM | POA: Diagnosis present

## 2022-09-24 MED ORDER — LIDOCAINE HCL URETHRAL/MUCOSAL 2 % EX GEL
1.0000 | Freq: Once | CUTANEOUS | Status: AC
  Administered 2022-09-24: 1
  Filled 2022-09-24: qty 11

## 2022-09-24 MED ORDER — IBUPROFEN 800 MG PO TABS
800.0000 mg | ORAL_TABLET | Freq: Once | ORAL | Status: AC
  Administered 2022-09-24: 800 mg via ORAL
  Filled 2022-09-24: qty 1

## 2022-09-24 NOTE — ED Provider Notes (Signed)
[facility] Provider Note   CSN: [id number] Arrival date & time: 09/24/22  1022     History  Chief Complaint  Patient presents with   Rectal Swelling    [facility] Scott is a 41 y.o. female.  41 yo F who presents with rectal pain. States that it started last night and feels like there is a mass on her anus that is very painful.  No history of hemorrhoids.  No history of masses.  Denies any fevers, chills, diarrhea or constipation.  Says it is now very painful to have a bowel movement and sit.        Home Medications Prior to Admission medications   Medication Sig Start Date End Date Taking? Authorizing Provider  gabapentin (NEURONTIN) 300 MG capsule Take 300 mg by mouth 3 (three) times daily. Patient not taking: Reported on 07/11/2022 05/30/21   [provider]  HYDROcodone-acetaminophen (NORCO/VICODIN) 5-325 MG tablet Take 1-2 tablets by mouth every 4 (four) hours as needed. Patient not taking: Reported on 07/11/2022 03/29/21   Malvin Johns, MD  ibuprofen (ADVIL) 600 MG tablet Take 600 mg by mouth every 6 (six) hours as needed. 04/15/21   [provider]  iron polysaccharides (NIFEREX) 150 MG capsule Take 1 capsule (150 mg total) by mouth daily. 04/02/21   Servando Salina, MD  levothyroxine (SYNTHROID) 100 MCG tablet Take 100 mcg by mouth every morning. 03/18/21   [provider]  ondansetron (ZOFRAN) 4 MG tablet Take 4 mg by mouth every 8 (eight) hours as needed for nausea/vomiting. 03/30/21   [provider]  polyethylene glycol powder (GLYCOLAX/MIRALAX) 17 GM/SCOOP powder Take 17 g by mouth daily. 04/15/21   [provider]  Polysacchar Iron-FA-B12 (FERREX 150 FORTE) 150-1-25 MG-MG-MCG CAPS Take 1 capsule by mouth daily. 04/02/21   [provider]  Semaglutide (OZEMPIC, 1 MG/DOSE, [place]) Inject into the skin.    [provider]  tranexamic acid (LYSTEDA) 650 MG TABS tablet Take by  mouth. 04/05/21   [provider]  Vitamin D, Ergocalciferol, (DRISDOL) 1.25 MG (50000 UNIT) CAPS capsule Take 50,000 Units by mouth once a week. 01/19/21   [provider]      Allergies    Latex and Percocet [oxycodone-acetaminophen]    Review of Systems   Review of Systems  Physical Exam Updated Vital Signs BP 113/66 (BP Location: Right Arm)   Pulse 73   Temp 98.1 F (36.7 C) (Oral)   Resp 17   Ht '5\' 3"'$  (1.6 m)   Wt 72.6 kg   LMP 03/16/2021   SpO2 100%   BMI 28.34 kg/m  Physical Exam Vitals and nursing note reviewed.  Constitutional:      General: She is not in acute distress.    Appearance: She is well-developed.  HENT:     Head: Normocephalic and atraumatic.     Right Ear: External ear normal.     Left Ear: External ear normal.     Nose: Nose normal.  Eyes:     Extraocular Movements: Extraocular movements intact.     Conjunctiva/sclera: Conjunctivae normal.     Pupils: Pupils are equal, round, and reactive to light.  Pulmonary:     Effort: Pulmonary effort is normal. No respiratory distress.  Abdominal:     General: Abdomen is flat. There is no distension.  Genitourinary:    Comments: Chaperoned by [facility] Scott.  Thrombosed hemorrhoid at 3 o'clock position.  Musculoskeletal:  General: No swelling.     Cervical back: Normal range of motion and neck supple.  Skin:    General: Skin is warm and dry.     Capillary Refill: Capillary refill takes less than 2 seconds.  Neurological:     Mental Status: She is alert and oriented to person, place, and time. Mental status is at baseline.  Psychiatric:        Mood and Affect: Mood normal.     ED Results / Procedures / Treatments   Labs (all labs ordered are listed, but only abnormal results are displayed) Labs Reviewed - No data to display  EKG None  Radiology No results found.  Procedures Procedures   Medications Ordered in ED Medications  ibuprofen (ADVIL) tablet 800 mg (800 mg Oral  Given 09/24/22 1217)  lidocaine (XYLOCAINE) 2 % jelly 1 Application (1 Application Other Given 09/24/22 1217)    ED Course/ Medical Decision Making/ A&P                           Medical Decision Making Risk Prescription drug management.   [facility] is a 41 y.o. female who presents with chief complaint of rectal pain.  This patient presents to the ED for concern of complaints listed in HPI, this involves an extensive number of treatment options, and is a complaint that carries with it a high risk of complications and morbidity.   Initial Ddx:  Hemorrhoid, perianal abscess, malignancy  MDM:  On exam patient has thrombosed hemorrhoid.  No infectious symptoms that would suggest a perianal abscess and did not appear to be present on exam. No concern for malignancy given how painful the mass is.   Plan:  Topical lidocaine Motrin  ED Summary:  Patient was given topical lidocaine to go home with for hemorrhoid.  Instructed her to follow-up with her primary doctor and potentially GI for hemorrhoid.  Also has been directed on symptomatic care for her hemorrhoids.  Return precautions discussed prior to discharge.  Dispo: DC Home. Return precautions discussed including, but not limited to, those listed in the AVS. Allowed pt time to ask questions which were answered fully prior to dc.  Records reviewed Care Everywhere I have reviewed the patients home medications and made adjustments as needed  Final Clinical Impression(s) / ED Diagnoses Final diagnoses:  Hemorrhoids, unspecified hemorrhoid type    Rx / DC Orders ED Discharge Orders     None         Fransico Meadow, MD 09/24/22 1539

## 2022-09-24 NOTE — ED Triage Notes (Signed)
Pt arrives pov, steady gait, c/o rectal swelling last night. Reports acute rectal pressure, denies bleeding

## 2022-09-24 NOTE — Discharge Instructions (Addendum)
Today you were seen in the emergency department for your hemorrhoid.    In the emergency department you were given pain medication.    At home, please Tylenol and Motrin as needed for pain.  Please also use Preparation H over-the-counter for any rectal pain that you may have as well as stool softener such as MiraLAX to prevent constipation.  You may also use sitz bath's.    Check your MyChart online for the results of any tests that had not resulted by the time you left the emergency department.   Follow-up with your primary doctor in 2-3 days regarding your visit.    Return immediately to the emergency department if you experience any of the following: Fevers, chills, vomiting, or any other concerning symptoms.    Thank you for visiting our Emergency Department. It was a pleasure taking care of you today.

## 2022-11-18 ENCOUNTER — Ambulatory Visit (HOSPITAL_COMMUNITY): Payer: BC Managed Care – PPO

## 2023-09-11 ENCOUNTER — Ambulatory Visit
Admission: EM | Admit: 2023-09-11 | Discharge: 2023-09-11 | Disposition: A | Discharge status: Home / Self Care | Payer: BC Managed Care – PPO | Attending: Internal Medicine | Admitting: Internal Medicine

## 2023-09-11 ENCOUNTER — Ambulatory Visit (HOSPITAL_BASED_OUTPATIENT_CLINIC_OR_DEPARTMENT_OTHER)
Admission: RE | Admit: 2023-09-11 | Discharge: 2023-09-11 | Disposition: A | Discharge status: Home / Self Care | Payer: BC Managed Care – PPO | Source: Ambulatory Visit | Attending: Urgent Care

## 2023-09-11 DIAGNOSIS — M79602 Pain in left arm: Secondary | ICD-10-CM | POA: Diagnosis not present

## 2023-09-11 DIAGNOSIS — M542 Cervicalgia: Secondary | ICD-10-CM | POA: Diagnosis not present

## 2023-09-11 DIAGNOSIS — M25532 Pain in left wrist: Secondary | ICD-10-CM | POA: Diagnosis not present

## 2023-09-11 DIAGNOSIS — M25522 Pain in left elbow: Secondary | ICD-10-CM | POA: Diagnosis not present

## 2023-09-11 DIAGNOSIS — M2550 Pain in unspecified joint: Secondary | ICD-10-CM

## 2023-09-11 DIAGNOSIS — W19XXXA Unspecified fall, initial encounter: Secondary | ICD-10-CM | POA: Insufficient documentation

## 2023-09-11 DIAGNOSIS — X58XXXA Exposure to other specified factors, initial encounter: Secondary | ICD-10-CM | POA: Diagnosis not present

## 2023-09-11 DIAGNOSIS — M546 Pain in thoracic spine: Secondary | ICD-10-CM | POA: Insufficient documentation

## 2023-09-11 DIAGNOSIS — M25512 Pain in left shoulder: Secondary | ICD-10-CM | POA: Diagnosis present

## 2023-09-11 MED ORDER — KETOROLAC TROMETHAMINE 60 MG/2ML IM SOLN
60.0000 mg | Freq: Once | INTRAMUSCULAR | Status: AC
  Administered 2023-09-11: 60 mg via INTRAMUSCULAR

## 2023-09-11 MED ORDER — CYCLOBENZAPRINE HCL 5 MG PO TABS: 5.0000 mg | ORAL_TABLET | Freq: Three times a day (TID) | ORAL | 0 refills | Status: AC | PRN

## 2023-09-11 MED ORDER — NAPROXEN 500 MG PO TABS: 500.0000 mg | ORAL_TABLET | Freq: Two times a day (BID) | ORAL | 0 refills | Status: AC

## 2023-09-11 NOTE — ED Triage Notes (Signed)
Pt reports pain in the left sided of the body after she fell on concrete floor yesterday. Reports on and off headache, left sided neck pain is the worse, left shoulder pain, left sided back pain, left arm pain, left wrist pain, left leg pain. Pain is worse when moving. Tylenol arthritis gives no relief.

## 2023-09-11 NOTE — ED Provider Notes (Signed)
[facility]  Note:  This document was prepared using Conservation officer, historic buildings and may include unintentional dictation errors.  MRN: [id number] DOB: [birth year]  Subjective:   [facility] is a 42 y.o. female presenting for 1 day history of neck pain, upper back pain, thoracic back pain, left shoulder pain, left arm pain, left hip pain, left leg pain.  Symptoms started after she fell accidentally.  Reports that she absorbed most of the impact with her left side landing mostly on her elbow followed by the rest of the left arm and flank side.  She did not have pain initially but has progressed into today.  She would like extensive imaging to confirm there is no fracture.  She is able to walk.  No fall, trauma, numbness or tingling, saddle paresthesia, changes to bowel or urinary habits, radicular symptoms.  Has used Tylenol without relief.  No current facility-administered medications for this encounter.  Current Outpatient Medications:    Multiple Vitamin (MULTIVITAMIN) capsule, Take 1 capsule by mouth daily., Disp: , Rfl:    gabapentin (NEURONTIN) 300 MG capsule, Take 300 mg by mouth 3 (three) times daily. (Patient not taking: Reported on 07/11/2022), Disp: , Rfl:    HYDROcodone-acetaminophen (NORCO/VICODIN) 5-325 MG tablet, Take 1-2 tablets by mouth every 4 (four) hours as needed. (Patient not taking: Reported on 07/11/2022), Disp: 15 tablet, Rfl: 0   ibuprofen (ADVIL) 600 MG tablet, Take 600 mg by mouth every 6 (six) hours as needed., Disp: , Rfl:    iron polysaccharides (NIFEREX) 150 MG capsule, Take 1 capsule (150 mg total) by mouth daily., Disp: 30 capsule, Rfl: 11   levothyroxine (SYNTHROID) 100 MCG tablet, Take 100 mcg by mouth every morning., Disp: , Rfl:    ondansetron (ZOFRAN) 4 MG tablet, Take 4 mg by mouth every 8 (eight) hours as needed for nausea/vomiting., Disp: , Rfl:    polyethylene glycol powder (GLYCOLAX/MIRALAX) 17 GM/SCOOP powder, Take  17 g by mouth daily., Disp: , Rfl:    Polysacchar Iron-FA-B12 (FERREX 150 FORTE) 150-1-25 MG-MG-MCG CAPS, Take 1 capsule by mouth daily., Disp: , Rfl:    Semaglutide (OZEMPIC, 1 MG/DOSE, [place]), Inject into the skin., Disp: , Rfl:    tranexamic acid (LYSTEDA) 650 MG TABS tablet, Take by mouth., Disp: , Rfl:    Vitamin D, Ergocalciferol, (DRISDOL) 1.25 MG (50000 UNIT) CAPS capsule, Take 50,000 Units by mouth once a week., Disp: , Rfl:    Allergies  Allergen Reactions   Latex Hives   Percocet [Oxycodone-Acetaminophen] Itching    Past Medical History:  Diagnosis Date   Hyperthyroidism      Past Surgical History:  Procedure Laterality Date   ABDOMINAL HYSTERECTOMY     CESAREAN SECTION     HERNIA REPAIR     IR RADIOLOGIST EVAL & MGMT  11/05/2019   THYROIDECTOMY      Family History  Problem Relation Age of Onset   Cancer Maternal Grandfather        prostate cancer    Social History   Tobacco Use   Smoking status: Former    Current packs/day: 0.00    Average packs/day: 0.3 packs/day for 20.0 years (5.0 ttl pk-yrs)    Types: Cigarettes    Start date: 12/19/1995    Quit date: 12/19/2015    Years since quitting: 7.7   Smokeless tobacco: Former  Substance Use Topics   Alcohol use: Yes    Comment: occasionally   Drug use: No  ROS   Objective:   Vitals: BP 128/78 (BP Location: Left Arm)   Pulse 69   Temp 98.7 F (37.1 C) (Oral)   Resp 16   LMP 03/16/2021   SpO2 96%   Physical Exam Constitutional:      General: She is not in acute distress.    Appearance: Normal appearance. She is well-developed. She is not ill-appearing, toxic-appearing or diaphoretic.  HENT:     Head: Normocephalic and atraumatic.     Nose: Nose normal.     Mouth/Throat:     Mouth: Mucous membranes are moist.  Eyes:     General: No scleral icterus.       Right eye: No discharge.        Left eye: No discharge.     Extraocular Movements: Extraocular movements intact.  Cardiovascular:      Rate and Rhythm: Normal rate.  Pulmonary:     Effort: Pulmonary effort is normal.  Musculoskeletal:     Comments: Full range of motion throughout.  Strength 5/5 for upper and lower extremities.  Patient ambulates without any assistance at expected pace.  No ecchymosis, swelling, lacerations or abrasions.  Patient does have paraspinal muscle tenderness along the entire back excluding the midline.  Patient was guarding extensively and therefore resisted the left upper extremity exam.  Skin:    General: Skin is warm and dry.  Neurological:     General: No focal deficit present.     Mental Status: She is alert and oriented to person, place, and time.  Psychiatric:        Mood and Affect: Mood normal.        Behavior: Behavior normal.    IM Toradol 60 mg administered in clinic.  DG Thoracic Spine 2 View  Result Date: 09/11/2023 CLINICAL DATA:  Fall 1 day ago. Left upper extremity pain involving shoulder, wrist and elbow. Cervical and thoracic back pain. EXAM: THORACIC SPINE 2 VIEWS COMPARISON:  None Available. FINDINGS: The alignment is maintained. Vertebral body heights are maintained. No evidence of acute fracture. No significant disc space narrowing. Posterior elements appear intact. There is no paravertebral soft tissue abnormality. IMPRESSION: Negative radiographs of the thoracic spine. Electronically Signed   By: Narda Rutherford M.D.   On: 09/11/2023 16:56   DG Wrist Complete Left  Result Date: 09/11/2023 CLINICAL DATA:  Fall 1 day ago. Left upper extremity pain involving shoulder, wrist and elbow. Cervical and thoracic back pain. EXAM: LEFT WRIST - COMPLETE 3+ VIEW COMPARISON:  Remote wrist radiograph 02/23/2012 FINDINGS: There is no evidence of fracture or dislocation. Lunotriquetral coalition, typically incidental. There is no evidence of arthropathy or other focal bone abnormality. Soft tissues are unremarkable. IMPRESSION: 1. No fracture or subluxation of the left wrist. 2.  Lunotriquetral coalition, typically incidental. Electronically Signed   By: Narda Rutherford M.D.   On: 09/11/2023 16:55   DG Cervical Spine Complete  Result Date: 09/11/2023 CLINICAL DATA:  Fall 1 day ago. Left upper extremity pain involving shoulder, wrist and elbow. Cervical and thoracic back pain. EXAM: CERVICAL SPINE - COMPLETE 4+ VIEW COMPARISON:  Cervical spine radiograph 07/30/2018 FINDINGS: Cervical spine alignment is maintained. Vertebral body heights are preserved. The dens is intact, tip partially obscured due to overlapping structures. Posterior elements appear well-aligned. There is no evidence of fracture. Minor disc space narrowing and spurring at C5-C6. No prevertebral soft tissue edema. IMPRESSION: 1. No fracture or subluxation of the cervical spine. 2. Minor degenerative disc disease at C5-C6. Electronically  Signed   By: Narda Rutherford M.D.   On: 09/11/2023 16:54   DG Elbow Complete Left  Result Date: 09/11/2023 CLINICAL DATA:  Fall 1 day ago. Left upper extremity pain involving shoulder, wrist and elbow. Cervical and thoracic back pain. EXAM: LEFT ELBOW - COMPLETE 3+ VIEW COMPARISON:  None Available. FINDINGS: There is no evidence of fracture, dislocation, or joint effusion. Normal alignment and joint spaces. There is no evidence of arthropathy or other focal bone abnormality. Soft tissues are unremarkable. IMPRESSION: Negative radiographs of the left elbow. Electronically Signed   By: Narda Rutherford M.D.   On: 09/11/2023 16:52   DG Shoulder Left  Result Date: 09/11/2023 CLINICAL DATA:  Fall 1 day ago. Left upper extremity pain involving shoulder, wrist and elbow. Cervical and thoracic back pain. EXAM: LEFT SHOULDER - 2+ VIEW COMPARISON:  None Available. FINDINGS: There is no evidence of fracture or dislocation. Alignment joint spaces are normal. There is no evidence of arthropathy or other focal bone abnormality. Soft tissues are unremarkable. IMPRESSION: Negative radiographs of  the left shoulder. Electronically Signed   By: Narda Rutherford M.D.   On: 09/11/2023 16:51     Assessment and Plan :   PDMP not reviewed this encounter.  1. Multiple joint pain   2. Accidental fall, initial encounter    Patient requested extensive imaging, exam was not amenable given extensive guarding.  All x-rays negative.  Recommend NSAID, muscle relaxant.  Counseled patient on potential for adverse effects with medications prescribed/recommended today, ER and return-to-clinic precautions discussed, patient verbalized understanding.    Wallis Bamberg, [place] 09/11/23 [phone]

## 2023-09-11 NOTE — Discharge Instructions (Addendum)
I have placed orders to have an x-ray done at the med center in [facility].  Please head there now.  Go through the main hospital and not the emergency room.  Once you are there and let them know that you will came to our clinic and we send she to their facility for an outpatient x-ray.  If no one is at the front desk then they are likely out the rest of the day and at that point you would have to go through the emergency room.  Do not check in as a patient through the emergency room.  Simply let them know that you are there for an outpatient x-ray from our clinic.  I will call you with your results and update our treatment plan if necessary after I get the report.  Please wait to go pick up your prescriptions for any medications I have prescribed for you until after we discussed your x-ray results.  Use naproxen for pain and inflammation. Start that medication tonight. Use cyclobenzaprine as a muscle relaxant to help with the muscle and back spasms. It can make you sleepy so if it does that then just use it at bedtime.

## 2023-10-16 ENCOUNTER — Ambulatory Visit: Payer: Self-pay | Admitting: Nurse Practitioner

## 2024-01-18 ENCOUNTER — Ambulatory Visit
Admission: EM | Admit: 2024-01-18 | Discharge: 2024-01-18 | Disposition: A | Discharge status: Home / Self Care | Payer: BC Managed Care – PPO | Attending: Family Medicine | Admitting: Family Medicine

## 2024-01-18 ENCOUNTER — Ambulatory Visit
Admission: RE | Admit: 2024-01-18 | Discharge: 2024-01-18 | Discharge status: Against Medical Advice | Payer: BC Managed Care – PPO | Source: Ambulatory Visit | Attending: Physician Assistant | Admitting: Physician Assistant

## 2024-01-18 ENCOUNTER — Other Ambulatory Visit: Payer: Self-pay

## 2024-01-18 DIAGNOSIS — L03213 Periorbital cellulitis: Secondary | ICD-10-CM

## 2024-01-18 MED ORDER — IBUPROFEN 600 MG PO TABS: 600.0000 mg | ORAL_TABLET | Freq: Four times a day (QID) | ORAL | 0 refills | Status: AC | PRN

## 2024-01-18 MED ORDER — AMOXICILLIN-POT CLAVULANATE 875-125 MG PO TABS: 1.0000 | ORAL_TABLET | Freq: Two times a day (BID) | ORAL | 0 refills | Status: AC

## 2024-01-18 NOTE — ED Triage Notes (Signed)
Pt c/o redness, swelling, pain to right upper eyelid day 2-denies injury-NAD-steady gait

## 2024-01-18 NOTE — ED Provider Notes (Signed)
[facility]  Note:  This document was prepared using Conservation officer, historic buildings and may include unintentional dictation errors.  MRN: [id number] DOB: 1981/08/09  Subjective:   [facility] is a 43 y.o. female presenting for 2-day history of acute onset persistent right upper eyelid pain, swelling.  No trauma to the eye.  No new make-up or eye products.  She does use a regular make-up but has not in the past 2 days since her eye problems started.  No recent illness, sinus symptoms.  No current facility-administered medications for this encounter.  Current Outpatient Medications:    cyclobenzaprine (FLEXERIL) 5 MG tablet, Take 1 tablet (5 mg total) by mouth 3 (three) times daily as needed for muscle spasms., Disp: 30 tablet, Rfl: 0   gabapentin (NEURONTIN) 300 MG capsule, Take 300 mg by mouth 3 (three) times daily. (Patient not taking: Reported on 07/11/2022), Disp: , Rfl:    HYDROcodone-acetaminophen (NORCO/VICODIN) 5-325 MG tablet, Take 1-2 tablets by mouth every 4 (four) hours as needed. (Patient not taking: Reported on 07/11/2022), Disp: 15 tablet, Rfl: 0   ibuprofen (ADVIL) 600 MG tablet, Take 600 mg by mouth every 6 (six) hours as needed., Disp: , Rfl:    iron polysaccharides (NIFEREX) 150 MG capsule, Take 1 capsule (150 mg total) by mouth daily., Disp: 30 capsule, Rfl: 11   levothyroxine (SYNTHROID) 100 MCG tablet, Take 100 mcg by mouth every morning., Disp: , Rfl:    Multiple Vitamin (MULTIVITAMIN) capsule, Take 1 capsule by mouth daily., Disp: , Rfl:    naproxen (NAPROSYN) 500 MG tablet, Take 1 tablet (500 mg total) by mouth 2 (two) times daily with a meal., Disp: 30 tablet, Rfl: 0   ondansetron (ZOFRAN) 4 MG tablet, Take 4 mg by mouth every 8 (eight) hours as needed for nausea/vomiting., Disp: , Rfl:    polyethylene glycol powder (GLYCOLAX/MIRALAX) 17 GM/SCOOP powder, Take 17 g by mouth daily., Disp: , Rfl:    Polysacchar Iron-FA-B12 (FERREX 150  FORTE) 150-1-25 MG-MG-MCG CAPS, Take 1 capsule by mouth daily., Disp: , Rfl:    Semaglutide (OZEMPIC, 1 MG/DOSE, [place]), Inject into the skin., Disp: , Rfl:    tranexamic acid (LYSTEDA) 650 MG TABS tablet, Take by mouth., Disp: , Rfl:    Vitamin D, Ergocalciferol, (DRISDOL) 1.25 MG (50000 UNIT) CAPS capsule, Take 50,000 Units by mouth once a week., Disp: , Rfl:    Allergies  Allergen Reactions   Latex Hives   Percocet [Oxycodone-Acetaminophen] Itching    Past Medical History:  Diagnosis Date   Hyperthyroidism      Past Surgical History:  Procedure Laterality Date   ABDOMINAL HYSTERECTOMY     CESAREAN SECTION     HERNIA REPAIR     IR RADIOLOGIST EVAL & MGMT  11/05/2019   THYROIDECTOMY      Family History  Problem Relation Age of Onset   Cancer Maternal Grandfather        prostate cancer    Social History   Tobacco Use   Smoking status: Former    Current packs/day: 0.00    Average packs/day: 0.3 packs/day for 20.0 years (5.0 ttl pk-yrs)    Types: Cigarettes    Start date: 12/19/1995    Quit date: 12/19/2015    Years since quitting: 8.0   Smokeless tobacco: Former  Building services engineer status: Never Used  Substance Use Topics   Alcohol use: Yes    Comment: occasionally   Drug use: No  ROS   Objective:   Vitals: BP (!) 142/90 (BP Location: Right Arm)   Pulse 70   Temp 99.1 F (37.3 C) (Oral)   Resp 16   LMP 03/16/2021   SpO2 99%   Physical Exam Constitutional:      General: She is not in acute distress.    Appearance: Normal appearance. She is well-developed. She is not ill-appearing, toxic-appearing or diaphoretic.  HENT:     Head: Normocephalic and atraumatic.     Nose: Nose normal.     Mouth/Throat:     Mouth: Mucous membranes are moist.  Eyes:     General: Lids are everted, no foreign bodies appreciated. Vision grossly intact. No scleral icterus.       Right eye: No foreign body, discharge or hordeolum.        Left eye: No foreign body, discharge  or hordeolum.     Extraocular Movements: Extraocular movements intact.     Right eye: Normal extraocular motion.     Left eye: Normal extraocular motion and no nystagmus.     Conjunctiva/sclera:     Right eye: Right conjunctiva is not injected. No chemosis, exudate or hemorrhage.    Left eye: Left conjunctiva is not injected. No chemosis, exudate or hemorrhage.  Cardiovascular:     Rate and Rhythm: Normal rate.  Pulmonary:     Effort: Pulmonary effort is normal.  Skin:    General: Skin is warm and dry.  Neurological:     General: No focal deficit present.     Mental Status: She is alert and oriented to person, place, and time.  Psychiatric:        Mood and Affect: Mood normal.        Behavior: Behavior normal.     Assessment and Plan :   PDMP not reviewed this encounter.  1. Preseptal cellulitis of right upper eyelid    Start Augmentin for preseptal cellulitis of the right upper eyelid.  Use ibuprofen for pain and inflammation.  Counseled patient on potential for adverse effects with medications prescribed/recommended today, ER and return-to-clinic precautions discussed, patient verbalized understanding.    Wallis Bamberg, [place] 01/18/24 1712
